# Patient Record
Sex: Male | Born: 1955 | Race: White | Hispanic: No | Marital: Married
Health system: Midwestern US, Community
[De-identification: ages and names within clinical notes are randomized; demographics above are authoritative.]

## PROBLEM LIST (undated history)

## (undated) DIAGNOSIS — E119 Type 2 diabetes mellitus without complications: Secondary | ICD-10-CM

## (undated) HISTORY — DX: Type 2 diabetes mellitus without complications: E11.9

## (undated) HISTORY — PX: OTHER SURGICAL HISTORY: SHX169

---

## 2015-05-07 ENCOUNTER — Ambulatory Visit (INDEPENDENT_AMBULATORY_CARE_PROVIDER_SITE_OTHER): Payer: BLUE CROSS/BLUE SHIELD | Admitting: Physician Assistant

## 2015-05-07 ENCOUNTER — Ambulatory Visit (INDEPENDENT_AMBULATORY_CARE_PROVIDER_SITE_OTHER): Payer: BLUE CROSS/BLUE SHIELD

## 2015-05-07 VITALS — BP 140/56 | HR 112 | Temp 100.3°F | Resp 16 | Ht 72.0 in | Wt 234.6 lb

## 2015-05-07 DIAGNOSIS — G629 Polyneuropathy, unspecified: Secondary | ICD-10-CM | POA: Diagnosis not present

## 2015-05-07 DIAGNOSIS — E1149 Type 2 diabetes mellitus with other diabetic neurological complication: Secondary | ICD-10-CM

## 2015-05-07 DIAGNOSIS — E1140 Type 2 diabetes mellitus with diabetic neuropathy, unspecified: Secondary | ICD-10-CM

## 2015-05-07 DIAGNOSIS — L03115 Cellulitis of right lower limb: Secondary | ICD-10-CM

## 2015-05-07 DIAGNOSIS — R509 Fever, unspecified: Secondary | ICD-10-CM | POA: Diagnosis not present

## 2015-05-07 DIAGNOSIS — E1142 Type 2 diabetes mellitus with diabetic polyneuropathy: Secondary | ICD-10-CM | POA: Diagnosis not present

## 2015-05-07 LAB — POCT CBC
Granulocyte percent: 89.3 %G — AB (ref 37–80)
HEMATOCRIT: 34.9 % — AB (ref 43.5–53.7)
Hemoglobin: 11.8 g/dL — AB (ref 14.1–18.1)
Lymph, poc: 0.6 (ref 0.6–3.4)
MCH, POC: 30.7 pg (ref 27–31.2)
MCHC: 33.9 g/dL (ref 31.8–35.4)
MCV: 90.6 fL (ref 80–97)
MID (CBC): 0.4 (ref 0–0.9)
MPV: 7 fL (ref 0–99.8)
PLATELET COUNT, POC: 228 10*3/uL (ref 142–424)
POC Granulocyte: 8.1 — AB (ref 2–6.9)
POC LYMPH PERCENT: 6.2 %L — AB (ref 10–50)
POC MID %: 4.5 %M (ref 0–12)
RBC: 3.85 M/uL — AB (ref 4.69–6.13)
RDW, POC: 13.3 %
WBC: 9.1 10*3/uL (ref 4.6–10.2)

## 2015-05-07 MED ORDER — CLINDAMYCIN HCL 300 MG PO CAPS: 300.0000 mg | ORAL_CAPSULE | Freq: Four times a day (QID) | ORAL | Status: AC

## 2015-05-07 MED ORDER — CEFTRIAXONE SODIUM 1 G IJ SOLR
1.0000 g | Freq: Once | INTRAMUSCULAR | Status: AC
  Administered 2015-05-07: 1 g via INTRAMUSCULAR

## 2015-05-07 NOTE — Progress Notes (Signed)
Ryan Stephens  MRN: [id number] DOB: 01-Apr-1956  Subjective:  Pt presents to clinic with fevers and chills and an infected left 5th toe. 2 months ago his podiatrist cut off a skin tag that regrew and it started to bleed about 1.5 weeks ago.  Then about a week ago he started the notice that the lateral foot was swelling but it would go down by the am.  He works outside and stands all day.  3 days ago they noticed the smell but they had to come to [place] for his mothers funeral and he was unable to see his foot surgeon before they left. 2 days ago he developed fever with chills and then yesterday she had rigors when his fever would get high.  He has no feeling in his feet due to his DM peripheral neuropathy.  They have been bandaging it for the last several days when it started to drain.    In June 2015 he developed cellulitis which ended in amputated right great toe ad distal 5th metatarsal partial amputation.  He had 7 surgeries with skin grafts and he has been fine since.  They are from [place] and plan to drive back tomorrow - he plans to call his surgeon tomorrow am.  current sugar 185 - he checked it in the room  There are no active problems to display for this patient.   No current outpatient prescriptions on file prior to visit.   No current facility-administered medications on file prior to visit.   No Known Allergies  Review of Systems  Constitutional: Positive for fever and chills.  Skin: Positive for wound.   Objective:  BP 140/56 mmHg  Pulse 112  Temp(Src) 100.3 F (37.9 C) (Oral)  Resp 16  Ht 6' (1.829 m)  Wt 234 lb 9.6 oz (106.414 kg)  BMI 31.81 kg/m2  SpO2 96%  Physical Exam  Constitutional: He is oriented to person, place, and time and well-developed, well-nourished, and in no distress.  HENT:  Head: Normocephalic and atraumatic.  Right Ear: External ear normal.  Left Ear: External ear normal.  Eyes: Conjunctivae are normal.  Neck: Normal range of  motion.  Pulmonary/Chest: Effort normal and breath sounds normal.  Musculoskeletal:  Right foot - Missing great toe.  Scarring mid foot from past surgeries.  5th toe macerated with cloudy blood draining from the web space of 4/5th toe with thick purulence removed for wound culture - there is erythema extending towards the 3rd metatarsal and half way towards the ankle on the lateral aspect of the foot. There is no erythema medial foot.  Neurological: He is alert and oriented to person, place, and time. Gait normal.  Skin: Skin is warm and dry.  Psychiatric: Mood, memory, affect and judgment normal.   [facility] reading (PRIMARY) by  Dr. Merla Riches. S/p bony destruction from past infection and amputation of great toe and distal 5th metatarsal.  Results for orders placed or performed in visit on 05/07/15  POCT CBC  Result Value Ref Range   WBC 9.1 4.6 - 10.2 K/uL   Lymph, poc 0.6 0.6 - 3.4   POC LYMPH PERCENT 6.2 (A) 10 - 50 %L   MID (cbc) 0.4 0 - 0.9   POC MID % 4.5 0 - 12 %M   POC Granulocyte 8.1 (A) 2 - 6.9   Granulocyte percent 89.3 (A) 37 - 80 %G   RBC 3.85 (A) 4.69 - 6.13 M/uL   Hemoglobin 11.8 (A) 14.1 - 18.1 g/dL  HCT, POC 34.9 (A) 43.5 - 53.7 %   MCV 90.6 80 - 97 fL   MCH, POC 30.7 27 - 31.2 pg   MCHC 33.9 31.8 - 35.4 g/dL   RDW, POC 09.813.3 %   Platelet Count, POC 228 142 - 424 K/uL   MPV 7.0 0 - 99.8 fL    Assessment and Plan :  Fever, unspecified fever cause - Plan: POCT CBC  Cellulitis of foot, right - Plan: DG Foot 2 Views Right, Wound culture, cefTRIAXone (ROCEPHIN) injection 1 g, clindamycin (CLEOCIN) 300 MG capsule  DM (diabetes mellitus), type 2 with neurological complications   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  He will continue motrin for fever control.  He will call his surgeon tomorrow am and get a quick appt.  He will keep the drsg in place.  Benny LennertSarah Chaze Hruska PA-C  [facility] 05/07/2015 4:00 PM

## 2015-05-07 NOTE — Patient Instructions (Signed)
Pharmacy: [id number], [place] - [address] [Patient Preferred] [phone]

## 2015-05-09 LAB — WOUND CULTURE: GRAM STAIN: NONE SEEN

## 2015-05-10 ENCOUNTER — Telehealth: Payer: Self-pay | Admitting: Physician Assistant

## 2015-05-10 NOTE — Telephone Encounter (Signed)
Please faxed to Dr Trisha Mangleiaz [phone] for the patients appt this afternoon (7/20) my OV and wound culture results.  Thanks

## 2015-05-10 NOTE — Telephone Encounter (Signed)
Spoke with patient regarding the results of his wound culture.

## 2015-05-10 NOTE — Telephone Encounter (Signed)
Records faxed thru Epic. °

## 2016-01-23 IMAGING — CR DG FOOT 2V*R*
2 series · 2 of 2 positions shown · non-contrast
Comparison: None.

CLINICAL DATA: Possible osteomyelitis, left foot cellulitis.

EXAM:
RIGHT FOOT - 2 VIEW

[AP]
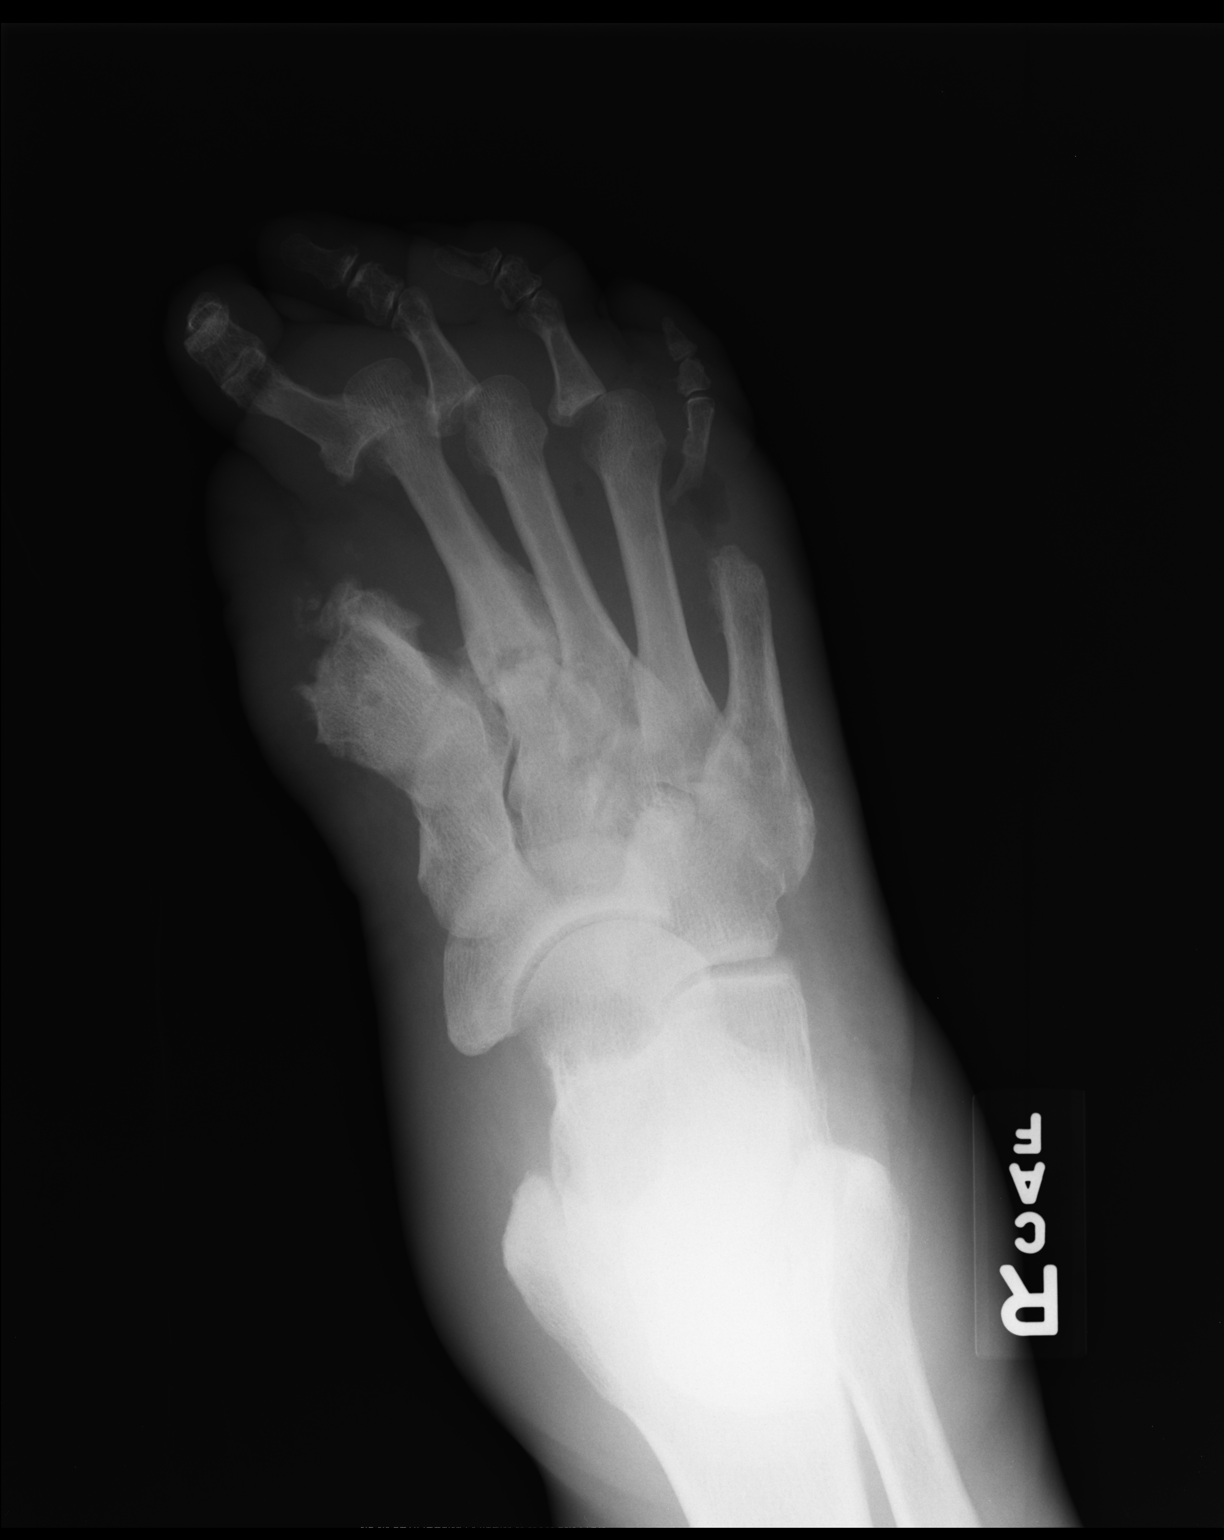

[lateral]
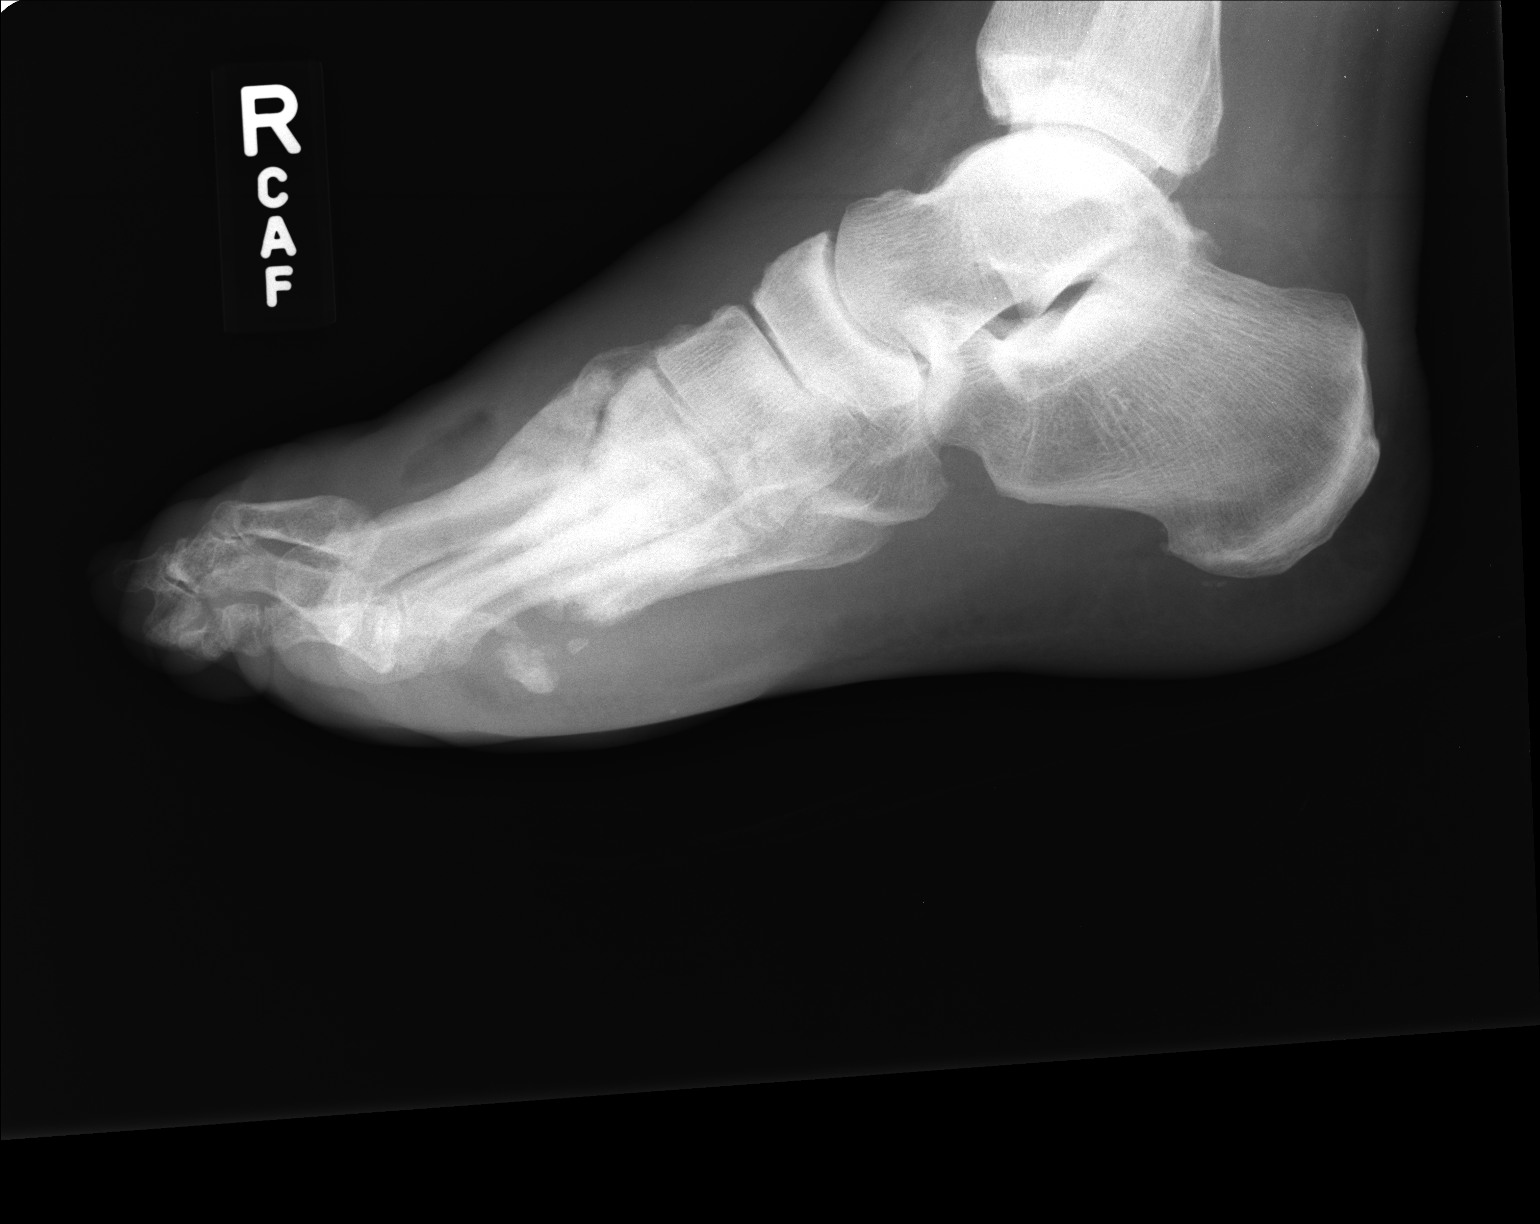

[2 of 2 positions shown; findings below may reference images not displayed]

FINDINGS: Two views of the right foot submitted. The patient is status post
amputation of distal aspect of first metatarsal and great toe. There
is probable chronic subluxation of second third and fourth
metatarsal phalangeal joint. There is soft tissue swelling and small
amount of soft tissue air dorsal metatarsal region. There is
probable prior amputation of distal fifth metatarsal. There is small
bony erosion at the base of proximal phalanx fifth toe.
Osteomyelitis cannot be excluded. Clinical correlation is necessary.
Further correlation with MRI could be performed as clinically
warranted.
IMPRESSION: The patient is status post amputation of distal aspect of first
metatarsal and great toe. There is probable chronic subluxation of
second third and fourth metatarsal phalangeal joint. There is soft
tissue swelling and small amount of soft tissue air dorsal
metatarsal region. There is probable prior amputation of distal
fifth metatarsal. There is small bony erosion at the base of
proximal phalanx fifth toe. Osteomyelitis cannot be excluded.
Clinical correlation is necessary. Further correlation with MRI
could be performed as clinically warranted.

## 2017-06-20 NOTE — Other (Signed)
PAT - SURGICAL PRE-ADMISSION INSTRUCTIONS    NAME:  [name] [name]                                                          TODAY'S DATE:  06/20/2017    SURGERY DATE:  06/25/2017                                  SURGERY ARRIVAL TIME:   1000    1. Do NOT eat or drink anything, including candy or gum, after MIDNIGHT on 06/24/2017 , unless you have specific instructions from your Surgeon or Anesthesia Provider to do so.  2. No smoking on the day of surgery.  3. No alcohol 24 hours prior to the day of surgery.  4. No recreational drugs for one week prior to the day of surgery.  5. Leave all valuables, including money/purse, at home.  6. Remove all jewelry, nail polish, makeup (including mascara); no lotions, powders, deodorant, or perfume/cologne/after shave.  7. Glasses/Contact lenses and Dentures may be worn to the hospital.  They will be removed prior to surgery.  8. Call your doctor if symptoms of a cold or illness develop within 24 ours prior to surgery.  9. AN ADULT MUST DRIVE YOU HOME AFTER OUTPATIENT SURGERY.   10. If you are having an OUTPATIENT procedure, please make arrangements for a responsible adult to be with you for 24 hours after your surgery.  11. If you are admitted to the hospital, you will be assigned to a bed after surgery is complete.  Normally a family member will not be able to see you until you are in your assigned bed.  12. Family is encouraged to accompany you to the hospital.  We ask visitors in the treatment area to be limited to ONE person at a time to ensure patient privacy.  EXCEPTIONS WILL BE MADE AS NEEDED.  13. Children under 12 are discouraged from entering the treatment area and need to be supervised by an adult when in the waiting room.    Special Instructions:    Bring any pertinent legal medical records., HOLD oral diabetic medication on the MORNING OF surgery., HOLD metformin/glucophage dose starting the EVENING BEFORE the day of surgery., Follow physician instructions about  insulin.  If NO instructions were given, take your usual dose of BASAL insulin the night BEFORE surgery.    Patient Prep:    use CHG solution    These surgical instructions were reviewed with [name] [name] during the PAT  phone call.    Directions:  On the morning of surgery, please go to the [facility].  Enter the building from the [address] entrance, 1st floor (next to the Emergency Room entrance).  Take the elevator to the 2nd floor.  Sign in at the Registration Desk.    If you have any questions and/or concerns, please do not hesitate to call:  (Prior to the day of surgery)  [facility] unit:  [phone]  (Day of surgery)  [facility]  [phone]

## 2017-06-25 ENCOUNTER — Inpatient Hospital Stay: Admit: 2017-06-25 | Payer: BLUE CROSS/BLUE SHIELD | Primary: Physician Assistant

## 2017-06-25 ENCOUNTER — Inpatient Hospital Stay
Admit: 2017-06-25 | Discharge: 2017-06-27 | Disposition: A | Discharge status: Home / Self Care | Payer: BLUE CROSS/BLUE SHIELD | Attending: Orthopaedic Surgery | Admitting: Orthopaedic Surgery

## 2017-06-25 DIAGNOSIS — M5116 Intervertebral disc disorders with radiculopathy, lumbar region: Secondary | ICD-10-CM

## 2017-06-25 LAB — GLUCOSE, POC
Glucose (POC): 142 mg/dL — ABNORMAL HIGH (ref 70–110)
Glucose (POC): 189 mg/dL — ABNORMAL HIGH (ref 70–110)

## 2017-06-25 MED ORDER — FENTANYL CITRATE (PF) 50 MCG/ML IJ SOLN
50 mcg/mL | INTRAMUSCULAR | Status: DC | PRN
Start: 2017-06-25 — End: 2017-06-25
  Administered 2017-06-25 (×2): via INTRAVENOUS

## 2017-06-25 MED ORDER — CEFAZOLIN 2 GM/50 ML IN DEXTROSE (ISO-OSMOTIC) IVPB
2 gram/50 mL | Freq: Three times a day (TID) | INTRAVENOUS | Status: AC
Start: 2017-06-25 — End: 2017-06-26
  Administered 2017-06-26 (×3): via INTRAVENOUS

## 2017-06-25 MED ORDER — WATER FOR INJECTION, STERILE INJECTION
INTRAMUSCULAR | Status: AC
Start: 2017-06-25 — End: ?

## 2017-06-25 MED ORDER — VANCOMYCIN 1,000 MG IV SOLR
1000 mg | INTRAVENOUS | Status: AC
Start: 2017-06-25 — End: ?

## 2017-06-25 MED ORDER — ONDANSETRON (PF) 4 MG/2 ML INJECTION
4 mg/2 mL | INTRAMUSCULAR | Status: AC
Start: 2017-06-25 — End: ?

## 2017-06-25 MED ORDER — DIAZEPAM 5 MG TAB
5 mg | Freq: Four times a day (QID) | ORAL | Status: DC | PRN
Start: 2017-06-25 — End: 2017-06-25
  Administered 2017-06-25: 20:00:00 via ORAL

## 2017-06-25 MED ORDER — NS WITH POTASSIUM CHLORIDE 20 MEQ/L IV
20 mEq/L | INTRAVENOUS | Status: DC
Start: 2017-06-25 — End: 2017-06-27
  Administered 2017-06-25 – 2017-06-27 (×4): via INTRAVENOUS

## 2017-06-25 MED ORDER — PROPOFOL 10 MG/ML IV EMUL
10 mg/mL | INTRAVENOUS | Status: AC
Start: 2017-06-25 — End: ?

## 2017-06-25 MED ORDER — MORPHINE 10 MG/ML INJ SOLUTION
10 mg/ml | INTRAMUSCULAR | Status: DC | PRN
Start: 2017-06-25 — End: 2017-06-25

## 2017-06-25 MED ORDER — MIDAZOLAM 1 MG/ML IJ SOLN
1 mg/mL | INTRAMUSCULAR | Status: DC | PRN
Start: 2017-06-25 — End: 2017-06-25
  Administered 2017-06-25: 16:00:00 via INTRAVENOUS

## 2017-06-25 MED ORDER — PROPOFOL 10 MG/ML IV EMUL
10 mg/mL | INTRAVENOUS | Status: DC | PRN
Start: 2017-06-25 — End: 2017-06-25
  Administered 2017-06-25: 16:00:00 via INTRAVENOUS

## 2017-06-25 MED ORDER — VANCOMYCIN 1,000 MG IV SOLR
1000 mg | INTRAVENOUS | Status: DC | PRN
Start: 2017-06-25 — End: 2017-06-25
  Administered 2017-06-25: 17:00:00

## 2017-06-25 MED ORDER — HYDROMORPHONE (PF) 1 MG/ML IJ SOLN
1 mg/mL | INTRAMUSCULAR | Status: AC
Start: 2017-06-25 — End: ?

## 2017-06-25 MED ORDER — ACETAMINOPHEN 325 MG TABLET
325 mg | ORAL | Status: DC | PRN
Start: 2017-06-25 — End: 2017-06-27

## 2017-06-25 MED ORDER — ONDANSETRON (PF) 4 MG/2 ML INJECTION
4 mg/2 mL | INTRAMUSCULAR | Status: DC | PRN
Start: 2017-06-25 — End: 2017-06-27
  Administered 2017-06-25: 20:00:00 via INTRAVENOUS

## 2017-06-25 MED ORDER — LIDOCAINE (PF) 10 MG/ML (1 %) IJ SOLN
10 mg/mL (1 %) | INTRAMUSCULAR | Status: DC | PRN
Start: 2017-06-25 — End: 2017-06-27

## 2017-06-25 MED ORDER — NEOSTIGMINE METHYLSULFATE 3 MG/3 ML (1 MG/ML) IV SYRINGE
3 mg/ mL (1 mg/mL) | INTRAVENOUS | Status: AC
Start: 2017-06-25 — End: ?

## 2017-06-25 MED ORDER — GLYCOPYRROLATE 0.2 MG/ML IJ SOLN
0.2 mg/mL | INTRAMUSCULAR | Status: AC
Start: 2017-06-25 — End: ?

## 2017-06-25 MED ORDER — THROMBIN (BOVINE) 5,000 UNIT TOPICAL SOLUTION
5000 unit | CUTANEOUS | Status: AC
Start: 2017-06-25 — End: ?

## 2017-06-25 MED ORDER — SODIUM CHLORIDE 0.9 % IJ SYRG
INTRAMUSCULAR | Status: DC | PRN
Start: 2017-06-25 — End: 2017-06-27

## 2017-06-25 MED ORDER — FENTANYL CITRATE (PF) 50 MCG/ML IJ SOLN
50 mcg/mL | INTRAMUSCULAR | Status: DC | PRN
Start: 2017-06-25 — End: 2017-06-25
  Administered 2017-06-25 (×4): via INTRAVENOUS

## 2017-06-25 MED ORDER — GELATIN ADSORBABLE 100 TOPICAL SPONGE
100 | CUTANEOUS | Status: DC | PRN
Start: 2017-06-25 — End: 2017-06-25
  Administered 2017-06-25: 16:00:00 via TOPICAL

## 2017-06-25 MED ORDER — SODIUM CHLORIDE 0.9 % IV
INTRAVENOUS | Status: DC | PRN
Start: 2017-06-25 — End: 2017-06-25
  Administered 2017-06-25: 16:00:00 via INTRAVENOUS

## 2017-06-25 MED ORDER — HYDROMORPHONE (PF) 1 MG/ML IJ SOLN
1 mg/mL | INTRAMUSCULAR | Status: DC | PRN
Start: 2017-06-25 — End: 2017-06-25
  Administered 2017-06-25 (×4): via INTRAVENOUS

## 2017-06-25 MED ORDER — LIDOCAINE (PF) 20 MG/ML (2 %) IJ SOLN
20 mg/mL (2 %) | INTRAMUSCULAR | Status: DC | PRN
Start: 2017-06-25 — End: 2017-06-25
  Administered 2017-06-25: 16:00:00 via INTRAVENOUS

## 2017-06-25 MED ORDER — VECURONIUM BROMIDE 10 MG IV SOLR
10 mg | INTRAVENOUS | Status: AC
Start: 2017-06-25 — End: ?

## 2017-06-25 MED ORDER — ONDANSETRON (PF) 4 MG/2 ML INJECTION
4 mg/2 mL | INTRAMUSCULAR | Status: DC | PRN
Start: 2017-06-25 — End: 2017-06-25
  Administered 2017-06-25: 19:00:00 via INTRAVENOUS

## 2017-06-25 MED ORDER — THROMBIN (BOVINE) 5,000 UNIT TOPICAL SOLUTION
5000 unit | CUTANEOUS | Status: DC | PRN
Start: 2017-06-25 — End: 2017-06-25
  Administered 2017-06-25: 17:00:00 via TOPICAL

## 2017-06-25 MED ORDER — GLYCOPYRROLATE 0.2 MG/ML IJ SOLN
0.2 mg/mL | INTRAMUSCULAR | Status: DC | PRN
Start: 2017-06-25 — End: 2017-06-25
  Administered 2017-06-25: 19:00:00 via INTRAVENOUS

## 2017-06-25 MED ORDER — CEFAZOLIN 1 GRAM SOLUTION FOR INJECTION
1 gram | INTRAMUSCULAR | Status: AC
Start: 2017-06-25 — End: ?

## 2017-06-25 MED ORDER — LACTATED RINGERS IV
INTRAVENOUS | Status: AC
Start: 2017-06-25 — End: 2017-06-26
  Administered 2017-06-25 (×2): via INTRAVENOUS

## 2017-06-25 MED ORDER — FENTANYL CITRATE (PF) 50 MCG/ML IJ SOLN
50 mcg/mL | INTRAMUSCULAR | Status: AC
Start: 2017-06-25 — End: ?

## 2017-06-25 MED ORDER — LABETALOL 5 MG/ML IV SYRINGE
20 mg/4 mL (5 mg/mL) | INTRAVENOUS | Status: DC | PRN
Start: 2017-06-25 — End: 2017-06-25
  Administered 2017-06-25 (×2): via INTRAVENOUS

## 2017-06-25 MED ORDER — HEPARIN (PORCINE) 1,000 UNIT/ML IJ SOLN
1000 unit/mL | INTRAMUSCULAR | Status: AC
Start: 2017-06-25 — End: ?

## 2017-06-25 MED ORDER — GLUCOSE 4 GRAM CHEWABLE TAB
4 gram | ORAL | Status: DC | PRN
Start: 2017-06-25 — End: 2017-06-25

## 2017-06-25 MED ORDER — LACTATED RINGERS IV
INTRAVENOUS | Status: DC
Start: 2017-06-25 — End: 2017-06-25

## 2017-06-25 MED ORDER — SODIUM CHLORIDE 0.9 % INJECTION
INTRAMUSCULAR | Status: AC
Start: 2017-06-25 — End: ?

## 2017-06-25 MED ORDER — LIDOCAINE (PF) 20 MG/ML (2 %) IV SYRINGE
100 mg/5 mL (2 %) | INTRAVENOUS | Status: AC
Start: 2017-06-25 — End: ?

## 2017-06-25 MED ORDER — VECURONIUM BROMIDE 10 MG IV SOLR
10 mg | INTRAVENOUS | Status: DC | PRN
Start: 2017-06-25 — End: 2017-06-25
  Administered 2017-06-25 (×8): via INTRAVENOUS

## 2017-06-25 MED ORDER — MIDAZOLAM 1 MG/ML IJ SOLN
1 mg/mL | INTRAMUSCULAR | Status: AC
Start: 2017-06-25 — End: ?

## 2017-06-25 MED ORDER — OXYCODONE-ACETAMINOPHEN 10 MG-325 MG TAB
10-325 mg | ORAL | Status: DC | PRN
Start: 2017-06-25 — End: 2017-06-25
  Administered 2017-06-25: 20:00:00 via ORAL

## 2017-06-25 MED ORDER — NEOSTIGMINE METHYLSULFATE 5 MG/5 ML (1 MG/ML) IV SYRINGE
5 mg/ mL (1 mg/mL) | INTRAVENOUS | Status: DC | PRN
Start: 2017-06-25 — End: 2017-06-25
  Administered 2017-06-25: 19:00:00 via INTRAVENOUS

## 2017-06-25 MED ORDER — MORPHINE 10 MG/ML INJ SOLUTION
10 mg/ml | INTRAMUSCULAR | Status: DC | PRN
Start: 2017-06-25 — End: 2017-06-25
  Administered 2017-06-25 (×4): via INTRAVENOUS

## 2017-06-25 MED ORDER — DEXTROSE 50% IN WATER (D50W) IV
INTRAVENOUS | Status: DC | PRN
Start: 2017-06-25 — End: 2017-06-25

## 2017-06-25 MED ORDER — INSULIN LISPRO 100 UNIT/ML INJECTION
100 unit/mL | Freq: Once | SUBCUTANEOUS | Status: AC
Start: 2017-06-25 — End: 2017-06-25
  Administered 2017-06-25: 19:00:00 via SUBCUTANEOUS

## 2017-06-25 MED ORDER — CEFAZOLIN 2 GM/50 ML IN DEXTROSE (ISO-OSMOTIC) IVPB
2 gram/50 mL | INTRAVENOUS | Status: AC
Start: 2017-06-25 — End: 2017-06-25
  Administered 2017-06-25: 16:00:00 via INTRAVENOUS

## 2017-06-25 MED ORDER — SODIUM CHLORIDE 0.9 % IRRIGATION SOLN
0.9 % | Status: DC | PRN
Start: 2017-06-25 — End: 2017-06-25
  Administered 2017-06-25: 17:00:00

## 2017-06-25 MED ORDER — DEXAMETHASONE SODIUM PHOSPHATE 4 MG/ML IJ SOLN
4 mg/mL | INTRAMUSCULAR | Status: AC
Start: 2017-06-25 — End: ?

## 2017-06-25 MED ORDER — DEXAMETHASONE SODIUM PHOSPHATE 4 MG/ML IJ SOLN
4 mg/mL | INTRAMUSCULAR | Status: DC | PRN
Start: 2017-06-25 — End: 2017-06-25
  Administered 2017-06-25: 16:00:00 via INTRAVENOUS

## 2017-06-25 MED ORDER — GLUCAGON 1 MG INJECTION
1 mg | INTRAMUSCULAR | Status: DC | PRN
Start: 2017-06-25 — End: 2017-06-25

## 2017-06-25 MED ORDER — SODIUM CHLORIDE 0.9 % IJ SYRG
Freq: Three times a day (TID) | INTRAMUSCULAR | Status: DC
Start: 2017-06-25 — End: 2017-06-25
  Administered 2017-06-25: 22:00:00 via INTRAVENOUS

## 2017-06-25 MED ORDER — FAMOTIDINE 20 MG TAB
20 mg | Freq: Once | ORAL | Status: AC
Start: 2017-06-25 — End: 2017-06-25
  Administered 2017-06-25: 15:00:00 via ORAL

## 2017-06-25 MED ORDER — ONDANSETRON (PF) 4 MG/2 ML INJECTION
4 mg/2 mL | Freq: Once | INTRAMUSCULAR | Status: DC
Start: 2017-06-25 — End: 2017-06-25

## 2017-06-25 MED FILL — MORPHINE 10 MG/ML IJ SOLN: 10 mg/mL | INTRAMUSCULAR | Qty: 1

## 2017-06-25 MED FILL — BD POSIFLUSH NORMAL SALINE 0.9 % INJECTION SYRINGE: INTRAMUSCULAR | Qty: 10

## 2017-06-25 MED FILL — HYDROMORPHONE (PF) 1 MG/ML IJ SOLN: 1 mg/mL | INTRAMUSCULAR | Qty: 1

## 2017-06-25 MED FILL — LACTATED RINGERS IV: INTRAVENOUS | Qty: 1000

## 2017-06-25 MED FILL — WATER FOR INJECTION, STERILE INJECTION: INTRAMUSCULAR | Qty: 10

## 2017-06-25 MED FILL — NS WITH POTASSIUM CHLORIDE 20 MEQ/L IV: 20 mEq/L | INTRAVENOUS | Qty: 1000

## 2017-06-25 MED FILL — DEXAMETHASONE SODIUM PHOSPHATE 4 MG/ML IJ SOLN: 4 mg/mL | INTRAMUSCULAR | Qty: 1

## 2017-06-25 MED FILL — PROPOFOL 10 MG/ML IV EMUL: 10 mg/mL | INTRAVENOUS | Qty: 20

## 2017-06-25 MED FILL — FAMOTIDINE 20 MG TAB: 20 mg | ORAL | Qty: 1

## 2017-06-25 MED FILL — LIDOCAINE (PF) 20 MG/ML (2 %) IV SYRINGE: 100 mg/5 mL (2 %) | INTRAVENOUS | Qty: 5

## 2017-06-25 MED FILL — NEOSTIGMINE METHYLSULFATE 3 MG/3 ML (1 MG/ML) IV SYRINGE: 3 mg/ mL (1 mg/mL) | INTRAVENOUS | Qty: 3

## 2017-06-25 MED FILL — GLYCOPYRROLATE 0.2 MG/ML IJ SOLN: 0.2 mg/mL | INTRAMUSCULAR | Qty: 2

## 2017-06-25 MED FILL — ONDANSETRON (PF) 4 MG/2 ML INJECTION: 4 mg/2 mL | INTRAMUSCULAR | Qty: 2

## 2017-06-25 MED FILL — VANCOMYCIN 1,000 MG IV SOLR: 1000 mg | INTRAVENOUS | Qty: 1000

## 2017-06-25 MED FILL — FENTANYL CITRATE (PF) 50 MCG/ML IJ SOLN: 50 mcg/mL | INTRAMUSCULAR | Qty: 2

## 2017-06-25 MED FILL — HEPARIN (PORCINE) 1,000 UNIT/ML IJ SOLN: 1000 unit/mL | INTRAMUSCULAR | Qty: 30

## 2017-06-25 MED FILL — THROMBIN-JMI 5,000 UNIT TOPICAL SOLUTION: 5000 unit | CUTANEOUS | Qty: 50000

## 2017-06-25 MED FILL — OXYCODONE-ACETAMINOPHEN 10 MG-325 MG TAB: 10-325 mg | ORAL | Qty: 2

## 2017-06-25 MED FILL — VECURONIUM BROMIDE 10 MG IV SOLR: 10 mg | INTRAVENOUS | Qty: 10

## 2017-06-25 MED FILL — SODIUM CHLORIDE 0.9 % INJECTION: INTRAMUSCULAR | Qty: 20

## 2017-06-25 MED FILL — INSULIN LISPRO 100 UNIT/ML INJECTION: 100 unit/mL | SUBCUTANEOUS | Qty: 1

## 2017-06-25 MED FILL — MIDAZOLAM 1 MG/ML IJ SOLN: 1 mg/mL | INTRAMUSCULAR | Qty: 2

## 2017-06-25 MED FILL — CEFAZOLIN 2 GM/50 ML IN DEXTROSE (ISO-OSMOTIC) IVPB: 2 gram/50 mL | INTRAVENOUS | Qty: 50

## 2017-06-25 MED FILL — DIAZEPAM 5 MG TAB: 5 mg | ORAL | Qty: 2

## 2017-06-25 MED FILL — CEFAZOLIN 1 GRAM SOLUTION FOR INJECTION: 1 gram | INTRAMUSCULAR | Qty: 2000

## 2017-06-25 NOTE — Consults (Signed)
Reason for consultation:    Monitoring and management of  acute and chronic medical problems in the post-operative    Diagnosis: hnp  m51.26    ??  Procedure:  Remove instrumentation and explore fusion l4-5,excision l4-5 discectomy,polaris and posterolateral spine fusion l4-s1    EBL 200      HPI: Pleasant gentleman post uneventful L/S surgery; post op pain controlled      ACTIVE MEDICAL PROBLEMS/PMH/PSH    T2 DM x 23 yrs  Diabetic neuropathy  Toes amputation, right foot  No ED or hospitalization for hypo/hyperglycemia  HTN   Dyslipidemia    Patient Active Problem List   Diagnosis Code   ??? HNP (herniated nucleus pulposus), lumbar M51.26       Past Medical History:   Diagnosis Date   ??? Diabetes (HCC)    ??? Hypertension      Past Surgical History:   Procedure Laterality Date   ??? HX ORTHOPAEDIC Right     foot   ??? NEUROLOGICAL PROCEDURE UNLISTED      back     PTA MEDICATIONS  Prescriptions Prior to Admission   Medication Sig   ??? insulin glargine (LANTUS,BASAGLAR) 100 unit/mL (3 mL) inpn 14 Units by SubCUTAneous route nightly.   ??? metFORMIN ER (GLUCOPHAGE XR) 500 mg tablet Take 500 mg by mouth two (2) times a day.   ??? NIFEdipine ER (PROCARDIA XL) 90 mg ER tablet Take 90 mg by mouth daily.   ??? losartan (COZAAR) 100 mg tablet Take 100 mg by mouth daily.   ??? pravastatin (PRAVACHOL) 20 mg tablet Take 20 mg by mouth nightly.     ALLERGIES: NKA    History reviewed. No pertinent family history.    Social History     Social History   ??? Marital status: MARRIED     Spouse name: N/A   ??? Number of children: N/A   ??? Years of education: N/A     Occupational History   ??? Not on file.     Social History Main Topics   ??? Smoking status: Former Smoker     Quit date: 06/20/2009   ??? Smokeless tobacco: Never Used   ??? Alcohol use Yes      Comment: socially   ??? Drug use: No   ??? Sexual activity: Not on file     Other Topics Concern   ??? Not on file     Social History Narrative     ??  FAMILY HISTORY   Father: living 59 y/o [facility]  Mother: deceased 6786    Brothers: 0  Sisters : 0  Children: 3  ??  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  Eyes: No visual changes  Ears: No hearing loss.  Nose:  No bleed  Neck: No pain. No masses . No lumps  Cardiac: No CP. DOE. No orthopnea. No PND. No leg edema . No palpitation  Respiratory: No cough . No SOB. No wheezing . No hemoptysis  GI: No nausea . No vomiting. No reflux. No abdominal pain. Normal BM . No black stool No blood in the stool  GU: No dysuria. No hematuria; nocturia x 2  M/S: + back pain  Neuro: Right leg numbness prior to surgery .  No weakness  Skin: No rash. No itching  HEM/LYMPH: no enlarged LN. No bruising.  Endocrine: no polydipsia.  Psych: No anxiety . No depression. No sleep difficulty  ??    Physical Exam:      Visit Vitals   ??? BP 165/83 (BP 1 Location: Right arm, BP Patient Position: At rest)   ??? Pulse 71   ??? Temp 97.6 ??F (36.4 ??C)   ??? Resp 11   ??? Ht 6\' 1"  (1.854 m)   ??? Wt 105.7 kg (233 lb)   ??? SpO2 94%   ??? BMI 30.74 kg/m2       GENERAL: NAD  HEENT: ??  Face:Symmetrical  CONJUNCTIVA: Pink. No swelling.?? No discharge ??  SCLERA: Anicteric ??  ORAL MUCOSA: Moist. No lesions  TONGUE: Moist. No lesions  TEETH: No broken or loose tooth ??  NECK: No JVD. No masses. No enlarged-tender lymph nodes.?? Trachea in mid line.?? THYROID: Size normal. No thyroid nodules ??  CAROTID ARTERIES: Pulsation 2+ bilaterally. No bruits  LUNGS: CTA. BS Normal Bilaterally  HEART: RRR???? Normal S1 S2. No Significant murmur. No S3 S4  ABDOMEN: Soft. Normal BS. No tenderness. No HSM /SMG. No palpable enlarged aorta. No bruits.  LE: No edema.  Right foot toe amputation  PULSES: Radial 2+; Femoral 2+; Posterior Tibialis 2+  SKIN:No rash. No ecchymosis.     MUSCULOSKELETAL: ?? ??  Adequate ROM in all extremeties    NEUROLOGIC:?? ??  Oriented  X 4  Muscle Strength, Bulk & Tone  RUE: strength, bulk & tone: WNL  LUE: strength, bulk & tone: WNL  RLE: strength, bulk & tone: WNL  LLE: strength, bulk & tone: WNL  ??  Reflexes: not tested  ??  PSYCHIATRIC ??  Mood: normal  Affect: appropriate?????????????????????????? ??  Oriented to Person???? Place???? Time?????????????????????? ??      Labs Reviewed:    Recent Results (from the past 24 hour(s))   GLUCOSE, POC    Collection Time: 06/25/17 11:21 AM   Result Value Ref Range    Glucose (POC) 142 (H) 70 - 110 mg/dL   GLUCOSE, POC    Collection Time: 06/25/17  3:28 PM   Result Value Ref Range    Glucose (POC) 189 (H) 70 - 110 mg/dL     Imaging Reviewed:    ASSESSMENT  Stable post op   Post op controlled  T2 DM x 23 yrs  Diabetic neuropathy  HTN   Dyslipidemia    PLAN  Resume PTA meds  Foley out in am  Modify analgesics  Diabetic diet      Glee Arvin, MD  06/25/2017, 7:06 PM      ??

## 2017-06-25 NOTE — Op Note (Signed)
[facility]  and L5.  Preexisting S1 pedicle screw was left in place and used.  In a similar fashion, the right hand side decorticated, bone grafted and screws placed.  Permanent PA and lateral radiographs obtained were satisfactory.  Medial pedicles palpated L4-L5.  No exposed hardware.    Rods placed bilaterally followed by set screws L4-5 distracted.  All set screws tightened and torqued to 120 inch pounds.    Transforaminal lumbar interbody fusion was then performed from  the right by packing the anterior portion of disk space material, bone graft material.  This was followed by placement of intervertebral body fusion device, that being a 13 mm Zyston cage filled with bone graft material.  Final radiographs obtained were satisfactory.    The right L4 and 5 nerve roots again visualized and were satisfactory.  The L4 nerve root visualized into the foramen.  Ventral surface of the nerve root palpated with a nerve hook.  There were no further far lateral fragments or nerve root compression.  Gelfoam placed over the exposed dura.  Additional bone graft placed about the rods.  Ball probes had been placed about the L4 and 5 pedicles previously without nerve root compromise.    We then closed in layers with powdered vancomycin in deep subcutaneous tissue.  Wound dressed.  Patient awakened and taken to recovery room in satisfactory condition without complication.    ESTIMATED BLOOD LOSS:  200 mL.    FLUIDS:  The patient received 2200 of crystalloid fluid.    URINE OUTPUT:  140 mL.    SPECIMENS REMOVED:  No specimen.    TUBES AND DRAINS:  No tubes or drains.    COUNTS:  Needle, sponge count correct without complication.    IMPLANTS:  0    COMPLICATIONS:    At time of dictation, the patient not awakened sufficiently to test motor sensation.      Kathline MagicJ. ABBOTT BYRD III, MD       JAB / MN  D: 06/25/2017 15:25     T: 06/25/2017 23:30  JOB #: [id number]  CC: Geovonni Meyerhoff. Colette RibasBYRD III MD

## 2017-06-25 NOTE — Op Note (Signed)
BRIEF OPERATIVE NOTE    Date of Procedure: 06/25/2017     Preoperative Diagnosis: hnp  m51.26    Postoperative Diagnosis: hnp  m51.26      Procedure: Procedure(s):  remove instrumentation and explore fusion l4-5,excision l4-5 discectomy,polaris and posterolateral spine fusion l4-s1    Surgeon(s) and Role:     * Corben Auzenne Roderick PeeAbbott Byrd, MD - Primary    Anesthesia: General     Findings: fusion solid l5-s1.  Sequestered roght far ;ateral l4-5 hnp as wwell as lateral contained r l4-5 hnp     Estimated Blood Loss: 200  Replaced0      Crystalloid2200        Urine140    Specimens: * No specimens in log *     Tubes/Drains: None    Needle/sponge count:  Correct    Complications: 0  Post op in room pt notes decreased r anterior thigh pain.  At/gs intact bilat    Plan    Dr Zenda Alpershasas tio follow  Up at lib  Dc foley in am  PT in am ambulate with tlso  Home 2-3 days with tlso and percocet  rto 1 month

## 2017-06-25 NOTE — Progress Notes (Signed)
Bedside and Verbal shift change report given to Abeya, RN (oncoming nurse) by Kerri Perchesarius Weaver, RN (offgoing nurse). Report included the following information SBAR, Kardex, Intake/Output, MAR and Recent Results.

## 2017-06-25 NOTE — Anesthesia Post-Procedure Evaluation (Signed)
Post-Anesthesia Evaluation and Assessment    Patient: [name] [name] Maya MRN: [id number]  SSN: [id number]    Date of Birth: 1956-06-29  Age: 61 y.o.  Sex: male       Cardiovascular Function/Vital Signs  Visit Vitals   ??? BP 165/83 (BP 1 Location: Right arm, BP Patient Position: At rest)   ??? Pulse 71   ??? Temp 36.4 ??C (97.6 ??F)   ??? Resp 11   ??? Ht 6\' 1"  (1.854 m)   ??? Wt 105.7 kg (233 lb)   ??? SpO2 94%   ??? BMI 30.74 kg/m2       Patient is status post general anesthesia for Procedure(s):  remove instrumentation and explore fusion l4-5,excision l4-5 discectomy,polaris and posterolateral spine fusion l4-s1.    Nausea/Vomiting: None    Postoperative hydration reviewed and adequate.    Pain:  Pain Scale 1: Numeric (0 - 10) (06/25/17 1700)  Pain Intensity 1: 0 (06/25/17 1700)   Managed    Neurological Status:   Neuro (WDL): Within Defined Limits (06/25/17 1548)  Neuro  LUE Motor Response: Purposeful (06/25/17 1700)  LLE Motor Response: Purposeful (06/25/17 1700)  RUE Motor Response: Purposeful (06/25/17 1700)  RLE Motor Response: Purposeful (06/25/17 1700)   At baseline    Mental Status and Level of Consciousness: Arousable    Pulmonary Status:   O2 Device: Room air (06/25/17 1709)   Adequate oxygenation and airway patent    Complications related to anesthesia: None    Post-anesthesia assessment completed. No concerns    Signed By: Brand MalesSarah R Leylanie Woodmansee, MD     June 25, 2017

## 2017-06-25 NOTE — Progress Notes (Signed)
Chaplain conducted an initial consultation and Spiritual Assessment for [name] [name], who is a 61 y.o.,male. Patient???s Primary Language is: [place].   According to the patient???s EMR Religious Affiliation is: No preference.     The reason the Patient came to the hospital is:   Patient Active Problem List    Diagnosis Date Noted   ??? HNP (herniated nucleus pulposus), lumbar 06/25/2017        The Chaplain provided the following Interventions:  Initiated a relationship of care and support.   Explored issues of faith, belief, spirituality and religious/ritual needs while hospitalized.  Listened empathically.  Provided information about Spiritual Care Services.  Offered prayer and assurance of continued prayers on patient's behalf.   Chart reviewed.    The following outcomes were achieved:  Patient shared limited information about both their medical narrative and spiritual journey/beliefs.  Patient processed feeling about current hospitalization.  Patient expressed gratitude for chaplain's visit.    Assessment:  Patient does not have any religious/cultural needs that will affect patient???s preferences in health care.  There are no further spiritual or religious issues which require intervention at this time.     Plan:  Chaplains will continue to follow and will provide pastoral care on an as needed/requested basis.  Chaplain recommends bedside caregivers page chaplain on duty if patient shows signs of acute spiritual or emotional distress.      Alcario DroughtEdmond Mensah, M.Div.  [facility]   Spiritual Care Department  [phone]

## 2017-06-25 NOTE — Anesthesia Pre-Procedure Evaluation (Signed)
Anesthetic History   No history of anesthetic complications            Review of Systems / Medical History  Patient summary reviewed, nursing notes reviewed and pertinent labs reviewed    Pulmonary  Within defined limits                 Neuro/Psych   Within defined limits           Cardiovascular    Hypertension: well controlled              Exercise tolerance: >4 METS  Comments: Exercise limited by back pain   GI/Hepatic/Renal  Within defined limits              Endo/Other    Diabetes: type 2, using insulin         Other Findings   Comments: Stopped taking xarelto in June 2018           Physical Exam    Airway  Mallampati: II  TM Distance: > 6 cm  Neck ROM: normal range of motion   Mouth opening: Normal     Cardiovascular  Regular rate and rhythm,  S1 and S2 normal,  no murmur, click, rub, or gallop  Rhythm: regular  Rate: normal         Dental  No notable dental hx       Pulmonary  Breath sounds clear to auscultation               Abdominal  Abdominal exam normal       Other Findings            Anesthetic Plan    ASA: 3  Anesthesia type: general          Induction: Intravenous  Anesthetic plan and risks discussed with: Patient

## 2017-06-25 NOTE — Consults (Signed)
Reason for consultation:    Monitoring and management of  acute and chronic medical problems in the post-operative    Diagnosis: hnp  m51.26    ??  Procedure:  Remove instrumentation and explore fusion l4-5,excision l4-5 discectomy,polaris and posterolateral spine fusion l4-s1    EBL 200      HPI: Pleasant gentleman post uneventful L/S surgery; post op pain controlled      ACTIVE MEDICAL PROBLEMS/PMH/PSH    T2 DM x 23 yrs  Diabetic neuropathy  Toes amputation, right foot  No ED or hospitalization for hypo/hyperglycemia  HTN   Dyslipidemia    Patient Active Problem List   Diagnosis Code   ??? HNP (herniated nucleus pulposus), lumbar M51.26       Past Medical History:   Diagnosis Date   ??? Diabetes (HCC)    ??? Hypertension      Past Surgical History:   Procedure Laterality Date   ??? HX ORTHOPAEDIC Right     foot   ??? NEUROLOGICAL PROCEDURE UNLISTED      back     PTA MEDICATIONS  Prescriptions Prior to Admission   Medication Sig   ??? insulin glargine (LANTUS,BASAGLAR) 100 unit/mL (3 mL) inpn 14 Units by SubCUTAneous route nightly.   ??? metFORMIN ER (GLUCOPHAGE XR) 500 mg tablet Take 500 mg by mouth two (2) times a day.   ??? NIFEdipine ER (PROCARDIA XL) 90 mg ER tablet Take 90 mg by mouth daily.   ??? losartan (COZAAR) 100 mg tablet Take 100 mg by mouth daily.   ??? pravastatin (PRAVACHOL) 20 mg tablet Take 20 mg by mouth nightly.     ALLERGIES: NKA    History reviewed. No pertinent family history.    Social History     Social History   ??? Marital status: MARRIED     Spouse name: N/A   ??? Number of children: N/A   ??? Years of education: N/A     Occupational History   ??? Not on file.     Social History Main Topics   ??? Smoking status: Former Smoker     Quit date: 06/20/2009   ??? Smokeless tobacco: Never Used   ??? Alcohol use Yes      Comment: socially   ??? Drug use: No   ??? Sexual activity: Not on file     Other Topics Concern   ??? Not on file     Social History Narrative     ??  FAMILY HISTORY   Father: living 59 y/o [facility]  Mother: deceased 686    Brothers: 0  Sisters : 0  Children: 3  ??  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  Eyes: No visual changes  Ears: No hearing loss.  Nose:  No bleed  Neck: No pain. No masses . No lumps  Cardiac: No CP. DOE. No orthopnea. No PND. No leg edema . No palpitation  Respiratory: No cough . No SOB. No wheezing . No hemoptysis  GI: No nausea . No vomiting. No reflux. No abdominal pain. Normal BM . No black stool No blood in the stool  GU: No dysuria. No hematuria; nocturia x 2  M/S: + back pain  Neuro: Right leg numbness prior to surgery .  No weakness  Skin: No rash. No itching  HEM/LYMPH: no enlarged LN. No bruising.  Endocrine: no polydipsia.  Psych: No anxiety . No depression. No sleep difficulty  ??    Physical Exam:      Visit Vitals   ??? BP 165/83 (BP 1 Location: Right arm, BP Patient Position: At rest)   ??? Pulse 71   ??? Temp 97.6 ??F (36.4 ??C)   ??? Resp 11   ??? Ht  (1.854 m)   ??? Wt 105.7 kg (233 lb)   ??? SpO2 94%   ??? BMI 30.74 kg/m2       GENERAL: NAD  HEENT: ??  Face:Symmetrical  CONJUNCTIVA: Pink. No swelling.?? No discharge ??  SCLERA: Anicteric ??  ORAL MUCOSA: Moist. No lesions  TONGUE: Moist. No lesions  TEETH: No broken or loose tooth ??  NECK: No JVD. No masses. No enlarged-tender lymph nodes.?? Trachea in mid line.?? THYROID: Size normal. No thyroid nodules ??  CAROTID ARTERIES: Pulsation 2+ bilaterally. No bruits  LUNGS: CTA. BS Normal Bilaterally  HEART: RRR???? Normal S1 S2. No Significant murmur. No S3 S4  ABDOMEN: Soft. Normal BS. No tenderness. No HSM /SMG. No palpable enlarged aorta. No bruits.  LE: No edema.  Right foot toe amputation  PULSES: Radial 2+; Femoral 2+; Posterior Tibialis 2+  SKIN:No rash. No ecchymosis.     MUSCULOSKELETAL: ?? ??  Adequate ROM in all extremeties    NEUROLOGIC:?? ??   Oriented X 4  Muscle Strength, Bulk & Tone  RUE: strength, bulk & tone: WNL  LUE: strength, bulk & tone: WNL  RLE: strength, bulk & tone: WNL  LLE: strength, bulk & tone: WNL  ??  Reflexes: not tested  ??  PSYCHIATRIC ??  Mood: normal  Affect: appropriate?????????????????????????? ??  Oriented to Person???? Place???? Time?????????????????????? ??      Labs Reviewed:    Recent Results (from the past 24 hour(s))   GLUCOSE, POC    Collection Time: 06/25/17 11:21 AM   Result Value Ref Range    Glucose (POC) 142 (H) 70 - 110 mg/dL   GLUCOSE, POC    Collection Time: 06/25/17  3:28 PM   Result Value Ref Range    Glucose (POC) 189 (H) 70 - 110 mg/dL     Imaging Reviewed:    ASSESSMENT  Stable post op   Post op controlled  T2 DM x 23 yrs  Diabetic neuropathy  HTN   Dyslipidemia    PLAN  Resume PTA meds  Foley out in am  Modify analgesics  Diabetic diet      Glee Arvin, MD  06/25/2017, 7:06 PM      ??

## 2017-06-25 NOTE — Other (Signed)
pts wife, KathleenNicholos Johns, in waiting room. Ok for info. [phone]

## 2017-06-25 NOTE — Progress Notes (Signed)
Problem: Falls - Risk of  Goal: *Absence of Falls  Document Schmid Fall Risk and appropriate interventions in the flowsheet.   Outcome: Progressing Towards Goal  Fall Risk Interventions:            Medication Interventions: Evaluate medications/consider consulting pharmacy

## 2017-06-25 NOTE — Other (Addendum)
1527 Pt received to PACU and connected to monitor. Bedside report given by Fidela JuneauKali, RN. Vital signs stable. Nurse at bedside. Will continue to monitor.     1610 Called to update pt's family on current status and room assignment.     1628 TRANSFER - OUT REPORT:    Verbal report given to Clydie BraunKaren, RN on [name] [name]  being transferred to Room 2218 for routine post - op       Report consisted of patient???s Situation, Background, Assessment and   Recommendations(SBAR).     Information from the following report(s) SBAR, Kardex and MAR was reviewed with the receiving nurse.    Lines:   Peripheral IV 06/25/17 Left Forearm (Active)   Site Assessment Clean, dry, & intact 06/25/2017  3:27 PM   Phlebitis Assessment 0 06/25/2017  3:27 PM   Infiltration Assessment 0 06/25/2017  3:27 PM   Dressing Status Clean, dry, & intact 06/25/2017  3:27 PM   Dressing Type Transparent;Tape 06/25/2017  3:27 PM   Hub Color/Line Status Pink;Infusing 06/25/2017  3:27 PM   Action Taken Open ports on tubing capped 06/25/2017  3:27 PM   Alcohol Cap Used Yes 06/25/2017  3:27 PM       Peripheral IV Left Hand (Active)   Site Assessment Clean, dry, & intact 06/25/2017  3:27 PM   Phlebitis Assessment 0 06/25/2017  3:27 PM   Infiltration Assessment 0 06/25/2017  3:27 PM   Dressing Status Clean, dry, & intact 06/25/2017  3:27 PM   Dressing Type Transparent;Tape 06/25/2017  3:27 PM   Hub Color/Line Status Purple;Capped 06/25/2017  3:27 PM   Action Taken Open ports on tubing capped 06/25/2017  3:27 PM   Alcohol Cap Used Yes 06/25/2017  3:27 PM        Opportunity for questions and clarification was provided.      Patient transported with:   Registered Nurse  Tech

## 2017-06-25 NOTE — Progress Notes (Signed)
Pt arrived to the floor from PACU via stretcher to room accompanied by wife. Pt is A&Ox4, denies pain. No distress noted. Pt oriented to room. Call bell and frequently used items in reach with bed locked in lowest position.

## 2017-06-25 NOTE — Op Note (Signed)
[facility]  OPERATIVE REPORT    Name:[name], [name]  MR#: [id number]  DOB: [birth year]  ACCOUNT #: [id number]   DATE OF SERVICE: 06/25/2017    SURGEON:  Roderick Pee, MD    PREOPERATIVE DIAGNOSES:  Right L4-L5 disk herniation status post L5-S1 decompression fusion with instrumentation:    A.  Right far lateral.   B.  Right lateral.    C.  Right L4 and 5 nerve root compression.    POSTOPERATIVE DIAGNOSIS:  0    PROCEDURES PERFORMED:  1.  Lumbar decompression L4-5 with bilateral exploration and decompression of the L4, L5 nerve roots with foraminotomy and partial facetectomy.  2.  Excision of right lateral and far lateral L4-5 disk herniation with decompression of the right L4 and 5 nerve roots.  3.  Polaris 5.5 mm open variable angle instrumentation L4-S1.  4.  Bilateral posterolateral spinal fusion L4-S1 with local bone graft, bone bank bone graft autologous growth factor.  5.  Transforaminal lumbar interbody fusion, L4-5, with local bone graft, bone bank bone graft and autologous growth factor.  6.  Placement of intervertebral body fusion device, that being a 13 mm Zyston cage filled with bone graft material L4-L5.    ASSISTANT:  Cowell    ANESTHESIA:  General.    FINDINGS:  1.  The patient's instrumentation was intact and fusion solid L5-S1.  2.  The patient has sequestered right L4-5 far lateral disk herniation and contained right lateral L4-5 disk herniation with compression of the L4 and 5 nerve roots.    DESCRIPTION OF PROCEDURE:  Under general anesthesia with the patient in a prone position on Wilson frame, peripheral nerves padded.  Back prepped and draped in sterile fashion.  Previous incision was opened.  Muscle subperiosteally exposed laterally to the L4 transverse process and the instrumentation distally.  Set screws were removed and the fusion explored with a spreader and curette was solid bilaterally.    The right L4 screw was removed.  It was not possible to remove the left L4  screw as the head popped off because 0 it was necessary to use metal cutting bit to fashion a post on the L4 screw and then special slotted screwdrivers were used to remove this.      Large and previous decompression along the caudal border of the L4 lamina sharply delineated with a curette and then partial facetectomy was performed with osteotome and curettes.  The anterior portion of the L3 lamina, L4 lamina and overhanging L5 superior articular facets were resected.  Decompression brought into the L4 and L5 pedicles bilaterally in the respective L4 and L5 nerve roots, decompressed bilaterally around the pedicles and 0 perform foraminotomy and partial facetectomy.    On the right hand side, the L4 pars and inferior articular facet was removed along with overhanging L5 superior articular facet to allow complete visualization of the right L4-5 foramen.  The L4 nerve root traced into the far lateral zone and sequestered disk fragments were readily apparent and removed.  The lateral disk herniation was removed with osteotome, curettes, grabbers, interbody shapers and preparation for posterior interbody fusion.    Pedicle holes made bilaterally L4 under direct visual control.  Holes probed with depth gauge, felt to be within bone.  Metallic marker was placed.  Radiographs taken were satisfactory.    Beginning first on the left-hand side, posterolateral elements L4-5 decorticated with Midas Rex drill.  Bone graft material was placed followed by placement of pedicle screws L4  and L5.  Preexisting S1 pedicle screw was left in place and used.  In a similar fashion, the right hand side decorticated, bone grafted and screws placed.  Permanent PA and lateral radiographs obtained were satisfactory.  Medial pedicles palpated L4-L5.  No exposed hardware.    Rods placed bilaterally followed by set screws L4-5 distracted.  All set screws tightened and torqued to 120 inch pounds.     Transforaminal lumbar interbody fusion was then performed from the right by packing the anterior portion of disk space material, bone graft material.  This was followed by placement of intervertebral body fusion device, that being a 13 mm Zyston cage filled with bone graft material.  Final radiographs obtained were satisfactory.    The right L4 and 5 nerve roots again visualized and were satisfactory.  The L4 nerve root visualized into the foramen.  Ventral surface of the nerve root palpated with a nerve hook.  There were no further far lateral fragments or nerve root compression.  Gelfoam placed over the exposed dura.  Additional bone graft placed about the rods.  Ball probes had been placed about the L4 and 5 pedicles previously without nerve root compromise.    We then closed in layers with powdered vancomycin in deep subcutaneous tissue.  Wound dressed.  Patient awakened and taken to recovery room in satisfactory condition without complication.    ESTIMATED BLOOD LOSS:  200 mL.    FLUIDS:  The patient received 2200 of crystalloid fluid.    URINE OUTPUT:  140 mL.    SPECIMENS REMOVED:  No specimen.    TUBES AND DRAINS:  No tubes or drains.    COUNTS:  Needle, sponge count correct without complication.    IMPLANTS:  0    COMPLICATIONS:    At time of dictation, the patient not awakened sufficiently to test motor sensation.      Kathline MagicJ. ABBOTT BYRD III, MD       JAB / MN  D: 06/25/2017 15:25     T: 06/25/2017 23:30  JOB #: [id number]  CC: Azjah Pardo. Colette RibasBYRD III MD

## 2017-06-25 NOTE — Progress Notes (Signed)
1935 Received bedside and verbal shift change report from Darius, RN report included the following information SBAR, Kardex, Intake/Output and MAR. Pt laying in the bed watching tv, alert and oriented denied acute respiratory distress nor discomfort at this time. Stable call bell within reach.    2033 Due meds and prn percocet for pain tolerated. Ice and ginger ale drink provided call bell within reach no acute distress.   2330 Pt remain stable medicated for pain as needed and tolerated without complaint. Gaylyn LambertMary Ellen, RN assumed primary care of pt bedside report with SBAR.

## 2017-06-25 NOTE — Op Note (Addendum)
BRIEF OPERATIVE NOTE    Date of Procedure: 06/25/2017     Preoperative Diagnosis: hnp  m51.26    Postoperative Diagnosis: hnp  m51.26      Procedure: Procedure(s):  remove instrumentation and explore fusion l4-5,excision l4-5 discectomy,polaris and posterolateral spine fusion l4-s1    Surgeon(s) and Role:     * Jennene Downie Abbott Byrd, MD - Primary    Anesthesia: General     Findings: fusion solid l5-s1.  Sequestered roght far ;ateral l4-5 hnp as wwell as lateral contained r l4-5 hnp     Estimated Blood Loss: 200  Replaced0      Crystalloid2200        Urine140    Specimens: * No specimens in log *     Tubes/Drains: None    Needle/sponge count:  Correct    Complications: 0  Post op in room pt notes decreased r anterior thigh pain.  At/gs intact bilat    Plan    Dr hasas tio follow  Up at lib  Dc foley in am  PT in am ambulate with tlso  Home 2-3 days with tlso and percocet  rto 1 month

## 2017-06-26 LAB — GLUCOSE, POC
Glucose (POC): 189 mg/dL — ABNORMAL HIGH (ref 70–110)
Glucose (POC): 207 mg/dL — ABNORMAL HIGH (ref 70–110)
Glucose (POC): 184 mg/dL — ABNORMAL HIGH (ref 70–110)

## 2017-06-26 LAB — HEMOGLOBIN A1C W/O EAG: Hemoglobin A1c: 6.4 % — ABNORMAL HIGH (ref 4.2–5.6)

## 2017-06-26 MED ORDER — MORPHINE 2 MG/ML INJECTION
2 mg/mL | INTRAMUSCULAR | Status: DC | PRN
Start: 2017-06-26 — End: 2017-06-27
  Administered 2017-06-26 (×2): via INTRAVENOUS

## 2017-06-26 MED ORDER — GLUCAGON 1 MG INJECTION
1 mg | INTRAMUSCULAR | Status: DC | PRN
Start: 2017-06-26 — End: 2017-06-27

## 2017-06-26 MED ORDER — DOCUSATE SODIUM 100 MG CAP
100 mg | Freq: Every day | ORAL | Status: DC
Start: 2017-06-26 — End: 2017-06-27
  Administered 2017-06-26 – 2017-06-27 (×2): via ORAL

## 2017-06-26 MED ORDER — DIAZEPAM 5 MG TAB
5 mg | Freq: Four times a day (QID) | ORAL | Status: DC | PRN
Start: 2017-06-26 — End: 2017-06-27
  Administered 2017-06-26 – 2017-06-27 (×2): via ORAL

## 2017-06-26 MED ORDER — GLUCOSE 4 GRAM CHEWABLE TAB
4 gram | ORAL | Status: DC | PRN
Start: 2017-06-26 — End: 2017-06-27

## 2017-06-26 MED ORDER — DEXTROSE 50% IN WATER (D50W) IV SYRG
INTRAVENOUS | Status: DC | PRN
Start: 2017-06-26 — End: 2017-06-27

## 2017-06-26 MED ORDER — LOSARTAN 50 MG TAB
50 mg | Freq: Every day | ORAL | Status: DC
Start: 2017-06-26 — End: 2017-06-27
  Administered 2017-06-27: 12:00:00 via ORAL

## 2017-06-26 MED ORDER — INSULIN GLARGINE 100 UNIT/ML INJECTION
100 unit/mL | Freq: Every day | SUBCUTANEOUS | Status: DC
Start: 2017-06-26 — End: 2017-06-27
  Administered 2017-06-26 – 2017-06-27 (×2): via SUBCUTANEOUS

## 2017-06-26 MED ORDER — OXYCODONE-ACETAMINOPHEN 10 MG-325 MG TAB
10-325 mg | ORAL | Status: DC | PRN
Start: 2017-06-26 — End: 2017-06-27
  Administered 2017-06-26 – 2017-06-27 (×9): via ORAL

## 2017-06-26 MED ORDER — PRAVASTATIN 20 MG TAB
20 mg | Freq: Every evening | ORAL | Status: DC
Start: 2017-06-26 — End: 2017-06-27
  Administered 2017-06-27: 03:00:00 via ORAL

## 2017-06-26 MED ORDER — INSULIN LISPRO 100 UNIT/ML INJECTION
100 unit/mL | Freq: Four times a day (QID) | SUBCUTANEOUS | Status: DC
Start: 2017-06-26 — End: 2017-06-27

## 2017-06-26 MED ORDER — MAGNESIUM HYDROXIDE 400 MG/5 ML ORAL SUSP
400 mg/5 mL | Freq: Every day | ORAL | Status: DC | PRN
Start: 2017-06-26 — End: 2017-06-27

## 2017-06-26 MED ORDER — NIFEDIPINE ER 90 MG 24 HR TAB
90 mg | Freq: Every day | ORAL | Status: DC
Start: 2017-06-26 — End: 2017-06-27
  Administered 2017-06-27: 12:00:00 via ORAL

## 2017-06-26 MED FILL — OXYCODONE-ACETAMINOPHEN 10 MG-325 MG TAB: 10-325 mg | ORAL | Qty: 2

## 2017-06-26 MED FILL — INSULIN GLARGINE 100 UNIT/ML INJECTION: 100 unit/mL | SUBCUTANEOUS | Qty: 1

## 2017-06-26 MED FILL — NS WITH POTASSIUM CHLORIDE 20 MEQ/L IV: 20 mEq/L | INTRAVENOUS | Qty: 1000

## 2017-06-26 MED FILL — CEFAZOLIN 2 GM/50 ML IN DEXTROSE (ISO-OSMOTIC) IVPB: 2 gram/50 mL | INTRAVENOUS | Qty: 50

## 2017-06-26 MED FILL — MORPHINE 2 MG/ML INJECTION: 2 mg/mL | INTRAMUSCULAR | Qty: 3

## 2017-06-26 MED FILL — DIAZEPAM 5 MG TAB: 5 mg | ORAL | Qty: 1

## 2017-06-26 MED FILL — DOCUSATE SODIUM 100 MG CAP: 100 mg | ORAL | Qty: 1

## 2017-06-26 NOTE — Other (Addendum)
Bedside and Verbal shift change report given to Carnesha S (oncoming nurse) by Zelphia CairoMary R (offgoing nurse). Report included the following information SBAR, Kardex, Intake/Output, MAR and Recent Results.     0950 Pt resting in bed. Complaints of pain have been addressed. Will continue to monitor.    1145 Reinforced pt's back dressing with ABD pad and tape. Some drainage present on bed pad. Will inform Dr. Zenda AlpersHasas.     1300 Pt resting in bed. Complaints of pain have been addressed. Will continue to monitor.    1745 Pt resting in bed. Complaints of pain have been addressed. Will continue to monitor.    Bedside and Verbal shift change report given to Cam RN (oncoming nurse) by Pete Glatterarnesha S (offgoing nurse). Report included the following information SBAR, Kardex, Intake/Output, MAR and Recent Results.

## 2017-06-26 NOTE — Progress Notes (Signed)
Problem: Falls - Risk of  Goal: *Absence of Falls  Document Schmid Fall Risk and appropriate interventions in the flowsheet.   Outcome: Progressing Towards Goal  Fall Risk Interventions:  Mobility Interventions: Communicate number of staff needed for ambulation/transfer         Medication Interventions: Teach patient to arise slowly    Elimination Interventions: Call light in reach

## 2017-06-26 NOTE — Other (Signed)
0600 Pt in bed foley d/c as ordered. Pt assisted oob with brace and walker, ambulated well in hallway  Made comfortable in bed, call bell and urinal in pt reach

## 2017-06-26 NOTE — Progress Notes (Signed)
Problem: Falls - Risk of  Goal: *Absence of Falls  Document Schmid Fall Risk and appropriate interventions in the flowsheet.   Outcome: Progressing Towards Goal  Fall Risk Interventions:  Mobility Interventions: Bed/chair exit alarm, PT Consult for assist device competence         Medication Interventions: Patient to call before getting OOB, Teach patient to arise slowly    Elimination Interventions: Patient to call for help with toileting needs, Call light in reach

## 2017-06-26 NOTE — Progress Notes (Signed)
Ortho rounds complete with Kia Riddick PA, all questions answered.  Communicated use of ICS, PT/OT.  Plan of care is discharge to home tomorrow if medically stable.  Nursing to monitor.

## 2017-06-26 NOTE — Other (Signed)
Bedside and Verbal shift change report given to Pearletha FurlKarnesha Strong RN (oncoming nurse) by Gaylyn LambertMary Ellen Ruege RN   (offgoing nurse). Report included the following information SBAR, OR Summary, Procedure Summary, Intake/Output, MAR, Recent Results and Med Rec Status.

## 2017-06-26 NOTE — Progress Notes (Signed)
Problem: Mobility Impaired (Adult and Pediatric)  Goal: *Acute Goals and Plan of Care (Insert Text)  Outcome: Resolved/Met Date Met: 06/26/17  PHYSICAL THERAPY: Initial Assessment/Discharge   INPATIENT: [employer]: Hospital Day: 2     Patient: [name] [name] 61 y.o. male)    Date: 06/26/2017  Primary Diagnosis: hnp  m51.26  HNP (herniated nucleus pulposus), lumbar   Procedure(s) (LRB):  remove instrumentation and explore fusion l4-5,excision l4-5 discectomy,polaris and posterolateral spine fusion l4-s1 (N/A), 1 Day Post-Op   Precautions: Spinal, Fall  PLOF: Independent    ASSESSMENT AND RECOMMENDATIONS:  Based on the objective data described below, the patient presents with mobility level appropriate for return to home. Educated on lumbar precautions and role of brace. Mod I for supine to sit. Mod I for sit to stand. Donned lumbar brace in standing with mod I. Amb 28f to restroom with mod I and ww. Good negotiation of obstacles with ww. Static standing 5 minutes with mod I at ww for use of urinal. Amb 2233fmod I with ww. Completed 6 steps with one handrail safely; limited by IV line.  Returned to seated in chair with all needs in reach. Educated on importance of mobility and home safety. Verbalized understanding to all education. Call bell in lap. Denies further mobility concerns with return home.   Skilled physical therapy is not indicated at this time.    EDUCATION:   Education:  Patient was educated on the following topics: Bed mobility, transfers, ADLs, balance, amb, stairs, safety, exercise, role of PT, plan of care, cognition, skin integrity, vitals    Barriers to Learning/Limitations: None  Compensate with: visual, verbal, tactile, kinesthetic cues/mode    Discharge Recommendations: None  Further Equipment Recommendations for Discharge: rolling walker      SUBJECTIVE:   Patient stated ???Can I first show you how I walk to the bathroom????    OBJECTIVE DATA SUMMARY:     Past Medical History:   Diagnosis Date    ??? Diabetes ([facility]   ??? Hypertension      Past Surgical History:   Procedure Laterality Date   ??? HX ORTHOPAEDIC Right     foot   ??? NEUROLOGICAL PROCEDURE UNLISTED      back       Eval Complexity: History: MEDIUM  Complexity : 1-2 comorbidities / personal factors will impact the outcome/ POC Exam:MEDIUM Complexity : 3 Standardized tests and measures addressing body structure, function, activity limitation and / or participation in recreation  Presentation: MEDIUM Complexity : Evolving with changing characteristics  Clinical Decision Making:Medium Complexity [employer] Standing Balance Scale 4+/5 Overall Complexity:MEDIUM    G CODE:  Mobility [id number]  CI= 1-19% G8980 D/C  CI= 1-19%.  The severity rating is based on the Other [employer] Incalance Scale 4+/5    [employer] Incalance Scale 4+/5  0: Pt performs 25% or less of standing activity (Max assist) CN, 100% impaired.  1: Pt supports self with upper extremities but requires therapist assistance. Pt performs 25-50% of effort (Mod assist) CM, 80% to <100% impaired.  1+: Pt supports self with upper extremities but requires therapist assistance. Pt performs >50% effort. (Min assist). CL, 60% to <80% impaired.  2: Pt supports self independently with both upper extremities (walker, crutches, parallel bars). CL, 60% to <80% impaired.  2+: Pt support self independently with 1 upper extremity (cane, crutch, 1 parallel bar). CK, 40% to <60% impaired.  3: Pt stands without upper extremity support for  up to 30 seconds. CK, 40% to <60% impaired.  3+: Pt stands without upper extremity support for 30 seconds or greater. CJ, 20% to <40% impaired.  4: Pt independently moves and returns center of gravity 1-2 inches in one plane. CJ, 20% to <40% impaired.  4+: Pt independently moves and returns center of gravity 1-2 inches in multiple planes. CI, 1% to <20% impaired.  5: Pt independently moves and returns center of gravity in all planes  greater than 2 inches. CH, 0% impaired.    Prior Level of Function/Home Situation: Independent  Home Situation  Home Environment: Private residence  # Steps to Enter: 6  Rails to Enter: Yes  One/Two Story Residence: Two story  Living Alone: No  Support Systems: Family member(s)  Patient Expects to be Discharged to:: Private residence  Current DME Used/Available at Home: None  Critical Behavior:  Neurologic State: Alert  Orientation Level: Oriented X4  Cognition: Follows commands  Safety/Judgement: Fall prevention  Psychosocial  Patient Behaviors: Cooperative    Manual Muscle Testing (LE)         R     L    Hip Flexion:   5/5  5/5  Knee EXT:   5/5  5/5  Knee FLEX:   5/5  5/5  Ankle DF:   5/5  5/5  _________________________________________________   Tone : BLE normal  Sensation: Intact to light touch BLE  Range Of Motion: BLE AROM WFL    Functional Mobility:      Functional Status      Indep   (I)   Mod I   Super-vision   Min A   Mod A   Max A   Total A   Assist x2 Verbal cues Additional time Not tested   Comments   Rolling _0   _1   _2  _3     _4     _5   _6   _7  _8  _9  _10     Supine to sit _11   _12   _13  _14   _15   _16   _17   _18  _19  _20  _21     Sit to supine _22   _23   _24  _25   _26   _27   _28   _29  _30  _31  _32     Sit to stand _33   _34   _35  _36   _37   _38   _39   _40  _41  _42  _43     Stand to sit _44   _45   _46  _47   _48   _49   _50   _51  _52  _53  _54     Bed to chair transfers _55   _56   _57  _58   _59   _60   _61   _62  _63  _64  _65         Balance    Good   Fair   Poor   Unable   Not tested   Comments   Sitting static _66   _67   _68   _69   _70     Sitting dynamic _71   _72   _73   _74   _75     Standing static _76   _77   _78   _79   _80     Standing dynamic _81   _82   _83   _84   _85       Mobility/Gait:   Level of Assistance: Modified independent  Assistive Device: brace/splint and rolling walker  Distance Ambulated: 220 feet       Stairs: limited by IV line  Level of Assistance: Modified independent  Assistive Device: none  Rail Use: left  Number of Stairs: 6    Therapeutic Exercises:   Static standing 5 minutes    Pain:  Pre treatment pain: 5  Post treatment pain: 5  Pain Scale 1: Numeric (0 - 10)  Pain Intensity 1: 5  Pain Location 1: Back  Pain Orientation 1: Lower  Pain Description 1: Aching;Pressure    Activity Tolerance:    Good  Please refer to the flowsheet for vital signs taken during this treatment.  After treatment:   _0          Patient left in no apparent distress sitting up in chair  _1          Patient left in no apparent distress in bed  _2          Call bell left within reach  _3          Nursing notified  _4          Caregiver present  _5          Bed alarm activated    COMMUNICATION/EDUCATION:   _6          Fall prevention education was provided and the patient/caregiver indicated understanding.  _7          Patient/family have participated as able in goal setting and plan of care.  _8          Patient/family agree to work toward stated goals and plan of care.  _9          Patient understands intent and goals of therapy, but is neutral about his/her participation.  _10          Patient is unable to participate in goal setting and plan of care.    Thank you for this referral.  Starr Sinclair, PT   Time Calculation: 18 mins

## 2017-06-26 NOTE — Progress Notes (Addendum)
Progress Note      Post Operative Day: 1    Assessment:    1. Status post  REMOVE SPINE FIX DEV,POST SGMTAL [22852] (SPINE LUMBAR POSTERIOR INTERBODY FUSION (PLIF))  EXPLORATION OF SPINAL FUSION [22830]  LAMINEC/FACETECT/FORAMIN,LUMBAR [63047]  PR LAMNOTMY INCL W/DCMPRSN NRV ROOT 1 INTRSPC LUMBR [63030]  PR ARTHRODESIS POSTERIOR/POSTEROLATERAL LUMBAR [22612]  SPINE FUSN,POST TECH,EA ADDNL SGMT [22614]  INSERT VERT FIX DEV,POST,3-6 SGMTS [22842]  SPIN BONE AUTOGRFT LOCAL [20936] for hnp  m51.26  HNP (herniated nucleus pulposus), lumbar 06/25/2017,   Progressing.      PLAN:    1. Mobilize.  Continue P.T.  2. Brace  3. Discharge Planning home tomorrow           HPI: [name] [name] is a 61 y.o. male patient without new complaints status post fusion for hnp  m51.26  HNP (herniated nucleus pulposus), lumbar 06/25/2017.  No new orthopaedic changes.    Blood pressure 125/77, pulse 78, temperature 98.3 ??F (36.8 ??C), resp. rate 18, height 6\' 1"  (1.854 m), weight 105.7 kg (233 lb), SpO2 94 %.    CBC w/Diff   No results found for: WBC, RBC, HCT, HCTEXT       Physical Assessment:  General: in no apparent distress   Extremities:  Neurovascular intact    Dressing:  Dry   DVT Exam:   No exam evidence to suggest DVT. Compartments soft and NT.            - Kia Riddick, PA-C  06/26/2017  Office [phone]  Cell [phone]    Agree; mobilize; pain control; PT/OT; Home likely am 9/7

## 2017-06-26 NOTE — Progress Notes (Signed)
Problem: Discharge Planning  Goal: *Discharge to safe environment  Outcome: Progressing Towards Goal  home

## 2017-06-26 NOTE — Progress Notes (Signed)
PROGRESS NOTE    Patient: [name] [name] MRN: [id number]  CSN: [id number]    Date of Birth: 23-Mar-1956  Age: 61 y.o.  Sex: male    DOA: 06/25/2017 LOS:  LOS: 1 day                    Diagnosis:??hnp ??m51.26 ??  ????  Procedure:  Remove instrumentation and explore fusion l4-5,excision l4-5 discectomy,polaris and posterolateral spine fusion l4-s1  ??  HPI:   Uneventful last 24 hrs; pain controlled  Some bloody drainage-dressing reinforced; no further problem  Foley out; voiding well  Out of bed 4 times today    ACTIVE MEDICAL PROBLEMS  T2 DM x 23 yrs  Diabetic neuropathy  Toes amputation, right foot  No ED or hospitalization for hypo/hyperglycemia  HTN   Dyslipidemia  ??     Hospital Problems  Never Reviewed          Codes Class Noted POA    HNP (herniated nucleus pulposus), lumbar ICD-10-CM: M51.26  ICD-9-CM: 722.10  06/25/2017 Unknown            Patient Vitals for the past 24 hrs:   Temp Pulse Resp BP SpO2   06/26/17 0801 98.3 ??F (36.8 ??C) 78 18 125/77 94 %   06/26/17 0340 98.4 ??F (36.9 ??C) 77 - 133/79 95 %   06/25/17 2315 98.5 ??F (36.9 ??C) 75 18 145/80 93 %   06/25/17 2026 97.7 ??F (36.5 ??C) 73 16 137/81 93 %   06/25/17 1709 97.6 ??F (36.4 ??C) 71 11 165/83 94 %   06/25/17 1621 - 74 11 - 93 %   06/25/17 1620 - 80 8 - 92 %   06/25/17 1607 97.4 ??F (36.3 ??C) 78 15 149/69 92 %   06/25/17 1558 - 76 16 155/83 92 %   06/25/17 1553 - 72 15 170/78 92 %   06/25/17 1548 - 72 16 (!) 163/93 92 %   06/25/17 1543 - 74 11 170/80 97 %   06/25/17 1529 - 76 - - 95 %   06/25/17 1528 - - - - 95 %   06/25/17 1527 97.6 ??F (36.4 ??C) 75 14 174/82 96 %       ROS:  Constitutional:  No chills .  No headache.   Neck: No pain.   Cardiac: no CP. SOB.   Respiratory: No cough . No SOB.    GI: no nausea . No vomiting. No reflux. No abdominal pain. No BM  GU: No dysuria. No hematuria.   M/S: + back pain  Neuro: No LE numbness. No Weakness  Skin: no rash. No itching      PHYSICAL EXAM:  GENERAL:   HEENT: ??  Face:Symmetrical.   CONJUNCTIVA: ?? Pink   LUNGS: CTA. BS Normal Bilaterally  HEART: RRR???? Normal S1 S2. No Significant murmur. No S3 S4  ABDOMEN: Soft. Normal BS. No tenderness.   LE: No edema. SCD in place  PULSES: Radial 2+; Posterior Tibialis 2+  SKIN: No rash. No ecchymosis.    Intake/Output Summary (Last 24 hours) at 06/26/17 1504  Last data filed at 06/26/17 0645   Gross per 24 hour   Intake             1545 ml   Output             1700 ml   Net             -155 ml  Current Shift:       Last three shifts:  09/04 1901 - 09/06 0700  In: 3545 [P.O.:420; I.V.:3025]  Out: 1940 [Urine:1740]    Recent Results (from the past 24 hour(s))   GLUCOSE, POC    Collection Time: 06/25/17  3:28 PM   Result Value Ref Range    Glucose (POC) 189 (H) 70 - 110 mg/dL   GLUCOSE, POC    Collection Time: 06/26/17  6:39 AM   Result Value Ref Range    Glucose (POC) 189 (H) 70 - 110 mg/dL   GLUCOSE, POC    Collection Time: 06/26/17 11:40 AM   Result Value Ref Range    Glucose (POC) 207 (H) 70 - 110 mg/dL   HEMOGLOBIN [id number] W/O EAG    Collection Time: 06/26/17 12:00 PM   Result Value Ref Range    Hemoglobin A1c 6.4 (H) 4.2 - 5.6 %       ASSESSMENT  Stable post op   Post op pain controlled  T2 DM   Diabetic neuropathy  HTN   Dyslipidemia  ??  PLAN  Resume PTA meds  CBC  Home in AM      Glee ArvinPedro D Maddilynn Esperanza, MD  06/26/2017, 3:04 PM

## 2017-06-26 NOTE — Progress Notes (Signed)
Problem: Falls - Risk of  Goal: *Absence of Falls  Document Schmid Fall Risk and appropriate interventions in the flowsheet.   Outcome: Progressing Towards Goal  Fall Risk Interventions:  Mobility Interventions: Patient to call before getting OOB, Utilize walker, cane, or other assistive device, PT Consult for assist device competence         Medication Interventions: Patient to call before getting OOB, Teach patient to arise slowly    Elimination Interventions: Patient to call for help with toileting needs

## 2017-06-26 NOTE — Progress Notes (Signed)
Care Management Interventions  PCP Verified by CM: Yes  Palliative Care Criteria Met (RRAT>21 & CHF Dx)?: No  Transition of Care Consult (CM Consult): Discharge Planning  Discharge Durable Medical Equipment: No  Physical Therapy Consult: No  Occupational Therapy Consult: No  Speech Therapy Consult: No  Current Support Network: Lives with Spouse  Confirm Follow Up Transport: Family  Plan discussed with Pt/Family/Caregiver: Yes  Discharge Location  Discharge Placement: Home     Reason for Admission:    Status post  REMOVE SPINE FIX DEV,POST SGMTAL [22852] (SPINE LUMBAR POSTERIOR INTERBODY FUSION (PLIF))  EXPLORATION OF SPINAL FUSION [22830]  LAMINEC/FACETECT/FORAMIN,LUMBAR [07867                   RRAT Score:   green                  Plan for utilizing home health:  Patient up ambulating without difficulty with brace  Discharge Recommendations: None per PT                    Likelihood of Readmission:  green                         Transition of Care Plan:      Spoke with patient in room, patient up ambulating without difficulty, patient lives with his wife and is independent with his care. Patient denies need for home with home health. Patient stated that he has a walker at home. He is independent with ADL's and has no DME's at this time.  He verified his address, insurance , PCP and phone # as correct on the facesheet.  Patient has designated _____spouse___________________ to participate in his/her discharge plan and to receive any needed information. [name] [name], [name] [name] [phone]. He plans to return home upon discharge and transportation will be provided by family.

## 2017-06-27 LAB — GLUCOSE, POC
Glucose (POC): 134 mg/dL — ABNORMAL HIGH (ref 70–110)
Glucose (POC): 103 mg/dL (ref 70–110)

## 2017-06-27 MED ORDER — OXYCODONE-ACETAMINOPHEN 10 MG-325 MG TAB
10-325 mg | ORAL_TABLET | ORAL | 0 refills | Status: AC | PRN
Start: 2017-06-27 — End: ?

## 2017-06-27 MED FILL — OXYCODONE-ACETAMINOPHEN 10 MG-325 MG TAB: 10-325 mg | ORAL | Qty: 2

## 2017-06-27 MED FILL — NS WITH POTASSIUM CHLORIDE 20 MEQ/L IV: 20 mEq/L | INTRAVENOUS | Qty: 1000

## 2017-06-27 MED FILL — INSULIN GLARGINE 100 UNIT/ML INJECTION: 100 unit/mL | SUBCUTANEOUS | Qty: 1

## 2017-06-27 MED FILL — PRAVASTATIN 20 MG TAB: 20 mg | ORAL | Qty: 1

## 2017-06-27 MED FILL — DIAZEPAM 5 MG TAB: 5 mg | ORAL | Qty: 1

## 2017-06-27 MED FILL — NIFEDIPINE ER 90 MG 24 HR TAB: 90 mg | ORAL | Qty: 1

## 2017-06-27 MED FILL — LOSARTAN 50 MG TAB: 50 mg | ORAL | Qty: 2

## 2017-06-27 MED FILL — DOCUSATE SODIUM 100 MG CAP: 100 mg | ORAL | Qty: 1

## 2017-06-27 NOTE — Discharge Summary (Signed)
Discharge Summary by Kathline MagicByrd, Shubh Chiara Abbott, MD at 06/27/17 1106                Author: Kathline MagicByrd, Aby Gessel Abbott, MD  Service: Orthopedic Surgery  Author Type: Physician       Filed: 07/01/17 0738  Date of Service: 06/27/17 1106  Status: Signed          Editor: Kathline MagicByrd, Jamail Cullers Abbott, MD (Physician)                       Orthopaedics      Patient without complaints status post REMOVE SPINE FIX DEV,POST SGMTAL [22852] (SPINE LUMBAR POSTERIOR INTERBODY FUSION (PLIF))   EXPLORATION OF SPINAL FUSION [22830]   LAMINEC/FACETECT/FORAMIN,LUMBAR [63047]   PR LAMNOTMY INCL W/DCMPRSN NRV ROOT 1 INTRSPC LUMBR [63030]   PR ARTHRODESIS POSTERIOR/POSTEROLATERAL LUMBAR [22612]   SPINE FUSN,POST TECH,EA ADDNL SGMT [22614]   INSERT VERT FIX DEV,POST,3-6 SGMTS [22842]   SPIN BONE AUTOGRFT LOCAL [20936] for hnp  m51.26   HNP (herniated nucleus pulposus), lumbar 06/25/2017.  No complications during hospital stay  and cleared for discharge home.  Decreased r anterior thigh pain.  voiding        Past Medical History:        Diagnosis  Date         ?  Diabetes (HCC)           ?  Hypertension               Visit Vitals         ?  BP  (!) 161/102     ?  Pulse  75     ?  Temp  98.4 ??F (36.9 ??C)     ?  Resp  17     ?  Ht  6\' 1"  (1.854 m)     ?  Wt  105.7 kg (233 lb)     ?  SpO2  93%         ?  BMI  30.74 kg/m2              CBC w/Diff          No results found for: WBC, RBC, HCT, MCV, MCH, MCHC, RDW, HCTEXT  No results found for: BANDS, LYMPHOCYTES, MONOS, EOS, BASOS, PROMYELOCYTE, BLAST, RDW        Cannot display discharge medications since this patient is not currently admitted.         Physical exam:  Dressing dry.  AT, GS intact Bilateral Lower Extremities.   Calves soft and nontender.         Assessment:  Status post REMOVE SPINE FIX DEV,POST SGMTAL [22852] (SPINE LUMBAR POSTERIOR INTERBODY FUSION (PLIF))   EXPLORATION OF SPINAL FUSION [22830]   LAMINEC/FACETECT/FORAMIN,LUMBAR [63047]   PR LAMNOTMY INCL W/DCMPRSN NRV ROOT 1 INTRSPC LUMBR [63030]   PR  ARTHRODESIS POSTERIOR/POSTEROLATERAL LUMBAR [22612]   SPINE FUSN,POST TECH,EA ADDNL SGMT [22614]   INSERT VERT FIX DEV,POST,3-6 SGMTS [22842]   SPIN BONE AUTOGRFT LOCAL [20936] for hnp  m51.26   HNP (herniated nucleus pulposus), lumbar,  doing well.      PLAN:  Discharge Home.  Followup in the office in 1 mo.percocet prn pain. Ambulate with TLSO.      Kathline MagicJ Abbott Byrd, MD   July 01, 2017

## 2017-06-27 NOTE — Progress Notes (Signed)
0730 received pt from Cam, pt c/o of drainage from wound, would like to see dr byrd ASAP, said he hasnt seen anyone since his surgery (there is a note from kia and dr Alveta Heimlichshuff from yesterday),  dressing reinforced.    [id number] Allegra in room with this RN to see pt's dressing and speak with pt. Allegra rounded on pt yesterday with Kia, told pt this. Dressing taken down and replaced with one steristrip, 4x4s, abd pad and tape.     [id number] discharge orders placed by Kia, pt's ride will be here around noon.    [id number] pt requesting 2 percocet right before he leaves for discharge.     1100 pt discharged.

## 2017-06-27 NOTE — Progress Notes (Signed)
Problem: Falls - Risk of  Goal: *Absence of Falls  Document Schmid Fall Risk and appropriate interventions in the flowsheet.   Outcome: Progressing Towards Goal  Fall Risk Interventions:  Mobility Interventions: Communicate number of staff needed for ambulation/transfer         Medication Interventions: Teach patient to arise slowly    Elimination Interventions: Call light in reach

## 2017-06-27 NOTE — Other (Signed)
Bedside and Verbal shift change report given to Jorje GuildJanna Pate,Rn (oncoming nurse) by Lenon Omsameron G McCubbins, RN (offgoing nurse). Report included the following information SBAR, Kardex, Intake/Output and MAR.

## 2017-06-27 NOTE — Progress Notes (Signed)
Ortho rounds complete with Kia Riddick, PA, all questions answered.  [facility]  Continue to encourage ICS, PT/OT, OOB with assist.

## 2017-06-27 NOTE — Discharge Summary (Signed)
Orthopaedics    Patient without complaints status post REMOVE SPINE FIX DEV,POST SGMTAL [22852] (SPINE LUMBAR POSTERIOR INTERBODY FUSION (PLIF))  EXPLORATION OF SPINAL FUSION [22830]  LAMINEC/FACETECT/FORAMIN,LUMBAR [63047]  PR LAMNOTMY INCL W/DCMPRSN NRV ROOT 1 INTRSPC LUMBR [63030]  PR ARTHRODESIS POSTERIOR/POSTEROLATERAL LUMBAR [22612]  SPINE FUSN,POST TECH,EA ADDNL SGMT [22614]  INSERT VERT FIX DEV,POST,3-6 SGMTS [22842]  SPIN BONE AUTOGRFT LOCAL [20936] for hnp  m51.26  HNP (herniated nucleus pulposus), lumbar 06/25/2017.  No complications during hospital stay and cleared for discharge home.  Decreased r anterior thigh pain.  voiding    Past Medical History:   Diagnosis Date   ??? Diabetes (HCC)    ??? Hypertension        Visit Vitals   ??? BP (!) 161/102   ??? Pulse 75   ??? Temp 98.4 ??F (36.9 ??C)   ??? Resp 17   ??? Ht 6\' 1"  (1.854 m)   ??? Wt 105.7 kg (233 lb)   ??? SpO2 93%   ??? BMI 30.74 kg/m2       CBC w/Diff    No results found for: WBC, RBC, HCT, MCV, MCH, MCHC, RDW, HCTEXT No results found for: BANDS, LYMPHOCYTES, MONOS, EOS, BASOS, PROMYELOCYTE, BLAST, RDW     Cannot display discharge medications since this patient is not currently admitted.      Physical exam:  Dressing dry.  AT, GS intact Bilateral Lower Extremities.   Calves soft and nontender.       Assessment:  Status post REMOVE SPINE FIX DEV,POST SGMTAL [22852] (SPINE LUMBAR POSTERIOR INTERBODY FUSION (PLIF))  EXPLORATION OF SPINAL FUSION [22830]  LAMINEC/FACETECT/FORAMIN,LUMBAR [63047]  PR LAMNOTMY INCL W/DCMPRSN NRV ROOT 1 INTRSPC LUMBR [63030]  PR ARTHRODESIS POSTERIOR/POSTEROLATERAL LUMBAR [22612]  SPINE FUSN,POST TECH,EA ADDNL SGMT [22614]  INSERT VERT FIX DEV,POST,3-6 SGMTS [22842]  SPIN BONE AUTOGRFT LOCAL [20936] for hnp  m51.26  HNP (herniated nucleus pulposus), lumbar,  doing well.    PLAN:  Discharge Home.  Followup in the office in 1 mo.percocet prn pain. Ambulate with TLSO.    Kathline MagicJ Abbott Byrd, MD  July 01, 2017

## 2017-06-27 NOTE — Progress Notes (Signed)
I have reviewed discharge instructions with the patient.  The patient verbalized understanding.

## 2017-06-27 NOTE — Progress Notes (Addendum)
Care Management Interventions  PCP Verified by CM: Yes  Palliative Care Criteria Met (RRAT>21 & CHF Dx)?: No  Transition of Care Consult (CM Consult): Discharge Planning  Discharge Durable Medical Equipment: No  Physical Therapy Consult: No  Occupational Therapy Consult: No  Speech Therapy Consult: No  Current Support Network: Lives with Spouse  Confirm Follow Up Transport: Family  Plan discussed with Pt/Family/Caregiver: Yes  Discharge Location  Discharge Placement: Home     Per PT Discharge Recommendations: None Patient stated that he has walker at home.

## 2017-07-03 MED FILL — NEOSTIGMINE METHYLSULFATE 3 MG/3 ML (1 MG/ML) IV SYRINGE: 3 mg/ mL (1 mg/mL) | INTRAVENOUS | Qty: 4

## 2017-07-03 MED FILL — PROPOFOL 10 MG/ML IV EMUL: 10 mg/mL | INTRAVENOUS | Qty: 150

## 2017-07-03 MED FILL — VECURONIUM BROMIDE 10 MG IV SOLR: 10 mg | INTRAVENOUS | Qty: 17

## 2017-07-03 MED FILL — LABETALOL 5 MG/ML IV SYRINGE: 20 mg/4 mL (5 mg/mL) | INTRAVENOUS | Qty: 20

## 2017-07-03 MED FILL — ONDANSETRON (PF) 4 MG/2 ML INJECTION: 4 mg/2 mL | INTRAMUSCULAR | Qty: 2

## 2017-07-03 MED FILL — DEXAMETHASONE SODIUM PHOSPHATE 4 MG/ML IJ SOLN: 4 mg/mL | INTRAMUSCULAR | Qty: 1

## 2017-07-03 MED FILL — SODIUM CHLORIDE 0.9 % IV: INTRAVENOUS | Qty: 1000

## 2017-07-03 MED FILL — LIDOCAINE (PF) 20 MG/ML (2 %) IJ SOLN: 20 mg/mL (2 %) | INTRAMUSCULAR | Qty: 100

## 2017-07-03 MED FILL — GLYCOPYRROLATE 0.2 MG/ML IJ SOLN: 0.2 mg/mL | INTRAMUSCULAR | Qty: 0.5
